# Patient Record
Sex: Female | Born: 1935 | Race: White | Hispanic: No | Marital: Single | Smoking: Never smoker
Health system: Southern US, Community
[De-identification: ages and names within clinical notes are randomized; demographics above are authoritative.]

## PROBLEM LIST (undated history)

## (undated) DIAGNOSIS — N814 Uterovaginal prolapse, unspecified: Secondary | ICD-10-CM

## (undated) DIAGNOSIS — E785 Hyperlipidemia, unspecified: Secondary | ICD-10-CM

## (undated) DIAGNOSIS — I10 Essential (primary) hypertension: Secondary | ICD-10-CM

## (undated) DIAGNOSIS — I272 Pulmonary hypertension, unspecified: Secondary | ICD-10-CM

## (undated) DIAGNOSIS — F0390 Unspecified dementia without behavioral disturbance: Secondary | ICD-10-CM

## (undated) DIAGNOSIS — R32 Unspecified urinary incontinence: Secondary | ICD-10-CM

## (undated) DIAGNOSIS — I340 Nonrheumatic mitral (valve) insufficiency: Secondary | ICD-10-CM

## (undated) DIAGNOSIS — E8809 Other disorders of plasma-protein metabolism, not elsewhere classified: Secondary | ICD-10-CM

## (undated) DIAGNOSIS — F0280 Dementia in other diseases classified elsewhere without behavioral disturbance: Secondary | ICD-10-CM

## (undated) DIAGNOSIS — G309 Alzheimer's disease, unspecified: Secondary | ICD-10-CM

## (undated) DIAGNOSIS — H3530 Unspecified macular degeneration: Secondary | ICD-10-CM

## (undated) DIAGNOSIS — G629 Polyneuropathy, unspecified: Secondary | ICD-10-CM

## (undated) HISTORY — PX: CATARACT EXTRACTION: SUR2

## (undated) HISTORY — PX: ABDOMINAL HYSTERECTOMY: SHX81

## (undated) HISTORY — PX: CHOLECYSTECTOMY: SHX55

## (undated) HISTORY — PX: APPENDECTOMY: SHX54

---

## 2014-01-02 ENCOUNTER — Ambulatory Visit: Payer: Self-pay | Admitting: Internal Medicine

## 2014-01-02 LAB — URINALYSIS, COMPLETE
BACTERIA: NEGATIVE
Bilirubin,UR: NEGATIVE
Blood: NEGATIVE
GLUCOSE, UR: NEGATIVE
Ketone: NEGATIVE
LEUKOCYTE ESTERASE: NEGATIVE
NITRITE: NEGATIVE
PROTEIN: NEGATIVE
Ph: 7 (ref 5.0–8.0)
RBC, UR: NONE SEEN /HPF (ref 0–5)
Specific Gravity: 1.015 (ref 1.000–1.030)

## 2014-01-27 ENCOUNTER — Emergency Department: Payer: Self-pay | Admitting: Emergency Medicine

## 2014-09-16 ENCOUNTER — Emergency Department
Admission: EM | Admit: 2014-09-16 | Discharge: 2014-09-16 | Disposition: A | Discharge status: Home / Self Care | Payer: Medicare Other | Attending: Emergency Medicine | Admitting: Emergency Medicine

## 2014-09-16 ENCOUNTER — Encounter: Payer: Self-pay | Admitting: Emergency Medicine

## 2014-09-16 ENCOUNTER — Emergency Department: Payer: Medicare Other

## 2014-09-16 DIAGNOSIS — Y9289 Other specified places as the place of occurrence of the external cause: Secondary | ICD-10-CM | POA: Diagnosis not present

## 2014-09-16 DIAGNOSIS — Z23 Encounter for immunization: Secondary | ICD-10-CM | POA: Insufficient documentation

## 2014-09-16 DIAGNOSIS — Z79899 Other long term (current) drug therapy: Secondary | ICD-10-CM | POA: Insufficient documentation

## 2014-09-16 DIAGNOSIS — I10 Essential (primary) hypertension: Secondary | ICD-10-CM | POA: Diagnosis not present

## 2014-09-16 DIAGNOSIS — Y9389 Activity, other specified: Secondary | ICD-10-CM | POA: Insufficient documentation

## 2014-09-16 DIAGNOSIS — IMO0002 Reserved for concepts with insufficient information to code with codable children: Secondary | ICD-10-CM

## 2014-09-16 DIAGNOSIS — S0181XA Laceration without foreign body of other part of head, initial encounter: Secondary | ICD-10-CM | POA: Insufficient documentation

## 2014-09-16 DIAGNOSIS — W01198A Fall on same level from slipping, tripping and stumbling with subsequent striking against other object, initial encounter: Secondary | ICD-10-CM | POA: Insufficient documentation

## 2014-09-16 DIAGNOSIS — Z7982 Long term (current) use of aspirin: Secondary | ICD-10-CM | POA: Insufficient documentation

## 2014-09-16 DIAGNOSIS — Y998 Other external cause status: Secondary | ICD-10-CM | POA: Insufficient documentation

## 2014-09-16 HISTORY — DX: Unspecified urinary incontinence: R32

## 2014-09-16 HISTORY — DX: Unspecified dementia, unspecified severity, without behavioral disturbance, psychotic disturbance, mood disturbance, and anxiety: F03.90

## 2014-09-16 HISTORY — DX: Uterovaginal prolapse, unspecified: N81.4

## 2014-09-16 HISTORY — DX: Alzheimer's disease, unspecified: G30.9

## 2014-09-16 HISTORY — DX: Hyperlipidemia, unspecified: E78.5

## 2014-09-16 HISTORY — DX: Essential (primary) hypertension: I10

## 2014-09-16 HISTORY — DX: Dementia in other diseases classified elsewhere, unspecified severity, without behavioral disturbance, psychotic disturbance, mood disturbance, and anxiety: F02.80

## 2014-09-16 HISTORY — DX: Unspecified macular degeneration: H35.30

## 2014-09-16 MED ORDER — LIDOCAINE-EPINEPHRINE (PF) 1 %-1:200000 IJ SOLN
INTRAMUSCULAR | Status: AC
  Filled 2014-09-16: qty 30

## 2014-09-16 MED ORDER — BACITRACIN-NEOMYCIN-POLYMYXIN 400-5-5000 EX OINT
TOPICAL_OINTMENT | Freq: Once | CUTANEOUS | Status: AC
  Administered 2014-09-16: 10:00:00 via TOPICAL
  Filled 2014-09-16: qty 1

## 2014-09-16 MED ORDER — TETANUS-DIPHTHERIA TOXOIDS TD 5-2 LFU IM INJ
0.5000 mL | INJECTION | Freq: Once | INTRAMUSCULAR | Status: AC
  Administered 2014-09-16: 0.5 mL via INTRAMUSCULAR
  Filled 2014-09-16: qty 0.5

## 2014-09-16 MED ORDER — LIDOCAINE-EPINEPHRINE 1 %-1:100000 IJ SOLN
10.0000 mL | Freq: Once | INTRAMUSCULAR | Status: AC
  Administered 2014-09-16: 10 mL via INTRADERMAL
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
## 2014-09-16 NOTE — ED Notes (Signed)
Laceration cleaned with sterile normal saline, neosporin applied, and wrapped with kerlex.

## 2014-09-16 NOTE — ED Notes (Signed)
Dr. Darnelle Catalan at bedside attending to laceration to head.

## 2014-09-16 NOTE — ED Notes (Signed)
Laceration closed with 7 staples by Dr. Darnelle Catalan.

## 2014-10-24 ENCOUNTER — Encounter: Payer: Self-pay | Admitting: Emergency Medicine

## 2014-10-24 ENCOUNTER — Emergency Department
Admission: EM | Admit: 2014-10-24 | Discharge: 2014-10-24 | Disposition: A | Discharge status: Home / Self Care | Payer: Medicare Other | Attending: Emergency Medicine | Admitting: Emergency Medicine

## 2014-10-24 ENCOUNTER — Emergency Department: Payer: Medicare Other

## 2014-10-24 DIAGNOSIS — I10 Essential (primary) hypertension: Secondary | ICD-10-CM | POA: Diagnosis not present

## 2014-10-24 DIAGNOSIS — W1839XA Other fall on same level, initial encounter: Secondary | ICD-10-CM | POA: Diagnosis not present

## 2014-10-24 DIAGNOSIS — Y998 Other external cause status: Secondary | ICD-10-CM | POA: Diagnosis not present

## 2014-10-24 DIAGNOSIS — Y92129 Unspecified place in nursing home as the place of occurrence of the external cause: Secondary | ICD-10-CM | POA: Insufficient documentation

## 2014-10-24 DIAGNOSIS — Z7982 Long term (current) use of aspirin: Secondary | ICD-10-CM | POA: Diagnosis not present

## 2014-10-24 DIAGNOSIS — R531 Weakness: Secondary | ICD-10-CM | POA: Diagnosis present

## 2014-10-24 DIAGNOSIS — Y9389 Activity, other specified: Secondary | ICD-10-CM | POA: Diagnosis not present

## 2014-10-24 DIAGNOSIS — M25552 Pain in left hip: Secondary | ICD-10-CM

## 2014-10-24 DIAGNOSIS — Z79899 Other long term (current) drug therapy: Secondary | ICD-10-CM | POA: Insufficient documentation

## 2014-10-24 DIAGNOSIS — S79912A Unspecified injury of left hip, initial encounter: Secondary | ICD-10-CM | POA: Insufficient documentation

## 2014-10-24 LAB — URINALYSIS COMPLETE WITH MICROSCOPIC (ARMC ONLY)
BILIRUBIN URINE: NEGATIVE
GLUCOSE, UA: NEGATIVE mg/dL
HGB URINE DIPSTICK: NEGATIVE
Ketones, ur: NEGATIVE mg/dL
Leukocytes, UA: NEGATIVE
NITRITE: NEGATIVE
Protein, ur: NEGATIVE mg/dL
SPECIFIC GRAVITY, URINE: 1.004 — AB (ref 1.005–1.030)
pH: 7 (ref 5.0–8.0)

## 2014-10-24 LAB — CBC WITH DIFFERENTIAL/PLATELET
BASOS PCT: 1 %
Basophils Absolute: 0.1 10*3/uL (ref 0–0.1)
EOS ABS: 0.4 10*3/uL (ref 0–0.7)
EOS PCT: 5 %
HCT: 44.5 % (ref 35.0–47.0)
HEMOGLOBIN: 14.8 g/dL (ref 12.0–16.0)
Lymphocytes Relative: 14 %
Lymphs Abs: 1.2 10*3/uL (ref 1.0–3.6)
MCH: 31.7 pg (ref 26.0–34.0)
MCHC: 33.3 g/dL (ref 32.0–36.0)
MCV: 95.2 fL (ref 80.0–100.0)
MONOS PCT: 7 %
Monocytes Absolute: 0.6 10*3/uL (ref 0.2–0.9)
NEUTROS PCT: 73 %
Neutro Abs: 6 10*3/uL (ref 1.4–6.5)
PLATELETS: 206 10*3/uL (ref 150–440)
RBC: 4.67 MIL/uL (ref 3.80–5.20)
RDW: 13.8 % (ref 11.5–14.5)
WBC: 8.2 10*3/uL (ref 3.6–11.0)

## 2014-10-24 LAB — BASIC METABOLIC PANEL
Anion gap: 9 (ref 5–15)
BUN: 22 mg/dL — AB (ref 6–20)
CALCIUM: 9.3 mg/dL (ref 8.9–10.3)
CHLORIDE: 101 mmol/L (ref 101–111)
CO2: 32 mmol/L (ref 22–32)
CREATININE: 0.56 mg/dL (ref 0.44–1.00)
Glucose, Bld: 100 mg/dL — ABNORMAL HIGH (ref 65–99)
Potassium: 3.8 mmol/L (ref 3.5–5.1)
SODIUM: 142 mmol/L (ref 135–145)

## 2014-10-24 LAB — GLUCOSE, CAPILLARY: Glucose-Capillary: 69 mg/dL (ref 65–99)

## 2014-10-24 NOTE — ED Notes (Signed)
[facility] with c/o of weakness since Wednesday. Per EMS, pt received flu shot Wednesday and sine then has declined in ambulation. Pt with hx of alzheimers disease. Alert and pleasant on arrival. BS 69

## 2014-10-24 NOTE — ED Provider Notes (Signed)
[facility] Emergency Department Provider Note  ____________________________________________  Time seen: 1230  I have reviewed the triage vital signs and the nursing notes.   HISTORY  Chief Complaint Weakness   History limited by: Dementia   HPI Regina Bridges is a 81 y.o. female who presents to the emergency department today because of concern for left hip pain and difficulty with ambulation. The patient unfortunately has a history of dementia so is unable to give a good history. States that she has fallen a couple of times with her left leg. Is unable to tell me how long it has been giving her issues. Denies any other symptoms to me.   Past Medical History  Diagnosis Date  . Hyperlipemia   . Dementia   . Alzheimer disease   . Macular degeneration   . Hypertension   . Uterine prolapse   . Urinary incontinence     There are no active problems to display for this patient.   History reviewed. No pertinent past surgical history.  Current Outpatient Rx  Name  Route  Sig  Dispense  Refill  . acetaminophen (TYLENOL) 500 MG tablet   Oral   Take 1,000 mg by mouth 2 (two) times daily.         Marland Kitchen. aspirin EC 81 MG tablet   Oral   Take 81 mg by mouth daily.         . benazepril (LOTENSIN) 10 MG tablet   Oral   Take 10 mg by mouth daily.         . carvedilol (COREG) 6.25 MG tablet   Oral   Take 6.25 mg by mouth 2 (two) times daily with a meal.         . haloperidol (HALDOL) 0.5 MG tablet   Oral   Take 0.5 mg by mouth every 8 (eight) hours as needed for agitation.         . Melatonin 1 MG TABS   Oral   Take 5 mg by mouth at bedtime.         . Multiple Vitamin (MULTIVITAMIN) tablet   Oral   Take 1 tablet by mouth daily.         . Multiple Vitamins-Minerals (PRESERVISION AREDS 2) CAPS   Oral   Take 1 capsule by mouth 2 (two) times daily.         Marland Kitchen. omeprazole (PRILOSEC) 20 MG capsule   Oral   Take 20 mg by mouth daily.          Marland Kitchen. oxybutynin (DITROPAN) 5 MG tablet   Oral   Take 5 mg by mouth 2 (two) times a week. Take 1 tablet twice a week on Tuesday and Saturday.         . polyethylene glycol (MIRALAX / GLYCOLAX) packet   Oral   Take 17 g by mouth daily.         Marland Kitchen. ibuprofen (ADVIL,MOTRIN) 200 MG tablet   Oral   Take 400 mg by mouth 2 (two) times daily as needed.           Allergies Review of patient's allergies indicates no known allergies.  History reviewed. No pertinent family history.  Social History Social History  Substance Use Topics  . Smoking status: Unknown If Ever Smoked  . Smokeless tobacco: None  . Alcohol Use: No    Review of Systems  Constitutional: Negative for fever. Cardiovascular: Negative for chest pain. Respiratory: Negative for shortness of breath. Gastrointestinal:  Negative for abdominal pain, vomiting and diarrhea. Genitourinary: Negative for dysuria. Musculoskeletal: Negative for back pain. Left hip pain. Skin: Negative for rash. Neurological: Negative for headaches, focal weakness or numbness.  10-point ROS otherwise negative.  ____________________________________________   PHYSICAL EXAM:  VITAL SIGNS: ED Triage Vitals  Enc Vitals Group     BP 10/24/14 1113 204/87 mmHg     Pulse Rate 10/24/14 1113 54     Resp 10/24/14 1113 20     Temp 10/24/14 1113 97.7 F (36.5 C)     Temp Source 10/24/14 1113 Oral     SpO2 10/24/14 1113 97 %     Weight 10/24/14 1113 140 lb 8 oz (63.73 kg)     Height 10/24/14 1113  (1.651 m)   Constitutional: Alert and oriented. Well appearing and in no distress. Eyes: Conjunctivae are normal. PERRL. Normal extraocular movements. ENT   Head: Normocephalic and atraumatic.   Nose: No congestion/rhinnorhea.   Mouth/Throat: Mucous membranes are moist.   Neck: No stridor. Hematological/Lymphatic/Immunilogical: No cervical lymphadenopathy. Cardiovascular: Normal rate, regular rhythm.  No murmurs, rubs, or  gallops. Respiratory: Normal respiratory effort without tachypnea nor retractions. Breath sounds are clear and equal bilaterally. No wheezes/rales/rhonchi. Gastrointestinal: Soft and nontender. No distention.  Genitourinary: Deferred Musculoskeletal: Normal range of motion in all extremities. No joint effusions.  No lower extremity tenderness nor edema. Neurologic:  Normal speech and language. No gross focal neurologic deficits are appreciated. Speech is normal.  Skin:  Skin is warm, dry and intact. No rash noted. Psychiatric: Mood and affect are normal. Speech and behavior are normal. Patient exhibits appropriate insight and judgment.  ____________________________________________    LABS (pertinent positives/negatives)  Labs Reviewed  BASIC METABOLIC PANEL - Abnormal; Notable for the following:    Glucose, Bld 100 (*)    BUN 22 (*)    All other components within normal limits  URINALYSIS COMPLETEWITH MICROSCOPIC ([facility] ONLY) - Abnormal; Notable for the following:    Color, Urine STRAW (*)    APPearance CLEAR (*)    Specific Gravity, Urine 1.004 (*)    Bacteria, UA RARE (*)    Squamous Epithelial / LPF 0-5 (*)    All other components within normal limits  CBC WITH DIFFERENTIAL/PLATELET  GLUCOSE, CAPILLARY     ____________________________________________   EKG  None  ____________________________________________    RADIOLOGY  Left hip x-ray IMPRESSION: No acute abnormality noted.  I, Alysiana Ethridge, personally viewed and evaluated these images (plain radiographs) as part of my medical decision making. ____________________________________________   PROCEDURES  Procedure(s) performed: None  Critical Care performed: No  ____________________________________________   INITIAL IMPRESSION / ASSESSMENT AND PLAN / ED COURSE  Pertinent labs & imaging results that were available during my care of the patient were reviewed by me and considered in my medical decision  making (see chart for details).  Patient presented to the emergency department because of concern for left hip pain and weakness. X-ray without concerning findings. Blood work without concerning findings. This point unclear cause of patient's complaints however she does appear quite comfortable at time of discharge. Will plan on having patient go back to living facility.  ____________________________________________   FINAL CLINICAL IMPRESSION(S) / ED DIAGNOSES  Final diagnoses:  Left hip pain     Phineas Semen, MD 10/24/14 [phone]

## 2014-10-24 NOTE — Discharge Instructions (Signed)
Please seek medical attention for any high fevers, chest pain, shortness of breath, change in behavior, persistent vomiting, bloody stool or any other new or concerning symptoms. ° ° °Joint Pain °Joint pain, which is also called arthralgia, can be caused by many things. Joint pain often goes away when you follow your health care provider's instructions for relieving pain at home. However, joint pain can also be caused by conditions that require further treatment. Common causes of joint pain include: °· Bruising in the area of the joint. °· Overuse of the joint. °· Wear and tear on the joints that occur with aging (osteoarthritis). °· Various other forms of arthritis. °· A buildup of a crystal form of uric acid in the joint (gout). °· Infections of the joint (septic arthritis) or of the bone (osteomyelitis). °Your health care provider may recommend medicine to help with the pain. If your joint pain continues, additional tests may be needed to diagnose your condition. °HOME CARE INSTRUCTIONS °Watch your condition for any changes. Follow these instructions as directed to lessen the pain that you are feeling. °· Take medicines only as directed by your health care provider. °· Rest the affected area for as long as your health care provider says that you should. If directed to do so, raise the painful joint above the level of your heart while you are sitting or lying down. °· Do not do things that cause or worsen pain. °· If directed, apply ice to the painful area: °¨ Put ice in a plastic bag. °¨ Place a towel between your skin and the bag. °¨ Leave the ice on for 20 minutes, 2-3 times per day. °· Wear an elastic bandage, splint, or sling as directed by your health care provider. Loosen the elastic bandage or splint if your fingers or toes become numb and tingle, or if they turn cold and blue. °· Begin exercising or stretching the affected area as directed by your health care provider. Ask your health care provider what  types of exercise are safe for you. °· Keep all follow-up visits as directed by your health care provider. This is important. °SEEK MEDICAL CARE IF: °· Your pain increases, and medicine does not help. °· Your joint pain does not improve within 3 days. °· You have increased bruising or swelling. °· You have a fever. °· You lose 10 lb (4.5 kg) or more without trying. °SEEK IMMEDIATE MEDICAL CARE IF: °· You are not able to move the joint. °· Your fingers or toes become numb or they turn cold and blue. °  °This information is not intended to replace advice given to you by your health care provider. Make sure you discuss any questions you have with your health care provider. °  °Document Released: 12/20/2004 Document Revised: 01/10/2014 Document Reviewed: 10/01/2013 °Elsevier Interactive Patient Education ©2016 [employer] ° °

## 2014-11-02 ENCOUNTER — Other Ambulatory Visit: Payer: Self-pay

## 2014-11-02 ENCOUNTER — Emergency Department: Payer: Medicare Other

## 2014-11-02 ENCOUNTER — Inpatient Hospital Stay
Admission: EM | Admit: 2014-11-02 | Discharge: 2014-11-06 | DRG: 481 | Disposition: A | Discharge status: Skilled Nursing Facility | Payer: Medicare Other | Attending: Internal Medicine | Admitting: Internal Medicine

## 2014-11-02 ENCOUNTER — Encounter: Payer: Self-pay | Admitting: Emergency Medicine

## 2014-11-02 DIAGNOSIS — S7292XA Unspecified fracture of left femur, initial encounter for closed fracture: Secondary | ICD-10-CM

## 2014-11-02 DIAGNOSIS — M79652 Pain in left thigh: Secondary | ICD-10-CM

## 2014-11-02 DIAGNOSIS — Z79899 Other long term (current) drug therapy: Secondary | ICD-10-CM | POA: Diagnosis not present

## 2014-11-02 DIAGNOSIS — Z9049 Acquired absence of other specified parts of digestive tract: Secondary | ICD-10-CM

## 2014-11-02 DIAGNOSIS — W19XXXA Unspecified fall, initial encounter: Secondary | ICD-10-CM | POA: Diagnosis present

## 2014-11-02 DIAGNOSIS — F0280 Dementia in other diseases classified elsewhere without behavioral disturbance: Secondary | ICD-10-CM | POA: Diagnosis present

## 2014-11-02 DIAGNOSIS — Z9849 Cataract extraction status, unspecified eye: Secondary | ICD-10-CM | POA: Diagnosis not present

## 2014-11-02 DIAGNOSIS — I10 Essential (primary) hypertension: Secondary | ICD-10-CM | POA: Diagnosis present

## 2014-11-02 DIAGNOSIS — Z7982 Long term (current) use of aspirin: Secondary | ICD-10-CM | POA: Diagnosis not present

## 2014-11-02 DIAGNOSIS — Z90710 Acquired absence of both cervix and uterus: Secondary | ICD-10-CM

## 2014-11-02 DIAGNOSIS — Z66 Do not resuscitate: Secondary | ICD-10-CM | POA: Diagnosis present

## 2014-11-02 DIAGNOSIS — H3530 Unspecified macular degeneration: Secondary | ICD-10-CM | POA: Diagnosis present

## 2014-11-02 DIAGNOSIS — I5181 Takotsubo syndrome: Secondary | ICD-10-CM | POA: Diagnosis present

## 2014-11-02 DIAGNOSIS — G20 Parkinson's disease: Secondary | ICD-10-CM | POA: Diagnosis present

## 2014-11-02 DIAGNOSIS — I340 Nonrheumatic mitral (valve) insufficiency: Secondary | ICD-10-CM | POA: Diagnosis present

## 2014-11-02 DIAGNOSIS — G309 Alzheimer's disease, unspecified: Secondary | ICD-10-CM | POA: Diagnosis present

## 2014-11-02 DIAGNOSIS — I429 Cardiomyopathy, unspecified: Secondary | ICD-10-CM | POA: Diagnosis present

## 2014-11-02 DIAGNOSIS — T148XXA Other injury of unspecified body region, initial encounter: Secondary | ICD-10-CM

## 2014-11-02 DIAGNOSIS — S72142A Displaced intertrochanteric fracture of left femur, initial encounter for closed fracture: Principal | ICD-10-CM | POA: Diagnosis present

## 2014-11-02 DIAGNOSIS — Z800 Family history of malignant neoplasm of digestive organs: Secondary | ICD-10-CM | POA: Diagnosis not present

## 2014-11-02 HISTORY — DX: Pulmonary hypertension, unspecified: I27.20

## 2014-11-02 HISTORY — DX: Other disorders of plasma-protein metabolism, not elsewhere classified: E88.09

## 2014-11-02 HISTORY — DX: Polyneuropathy, unspecified: G62.9

## 2014-11-02 HISTORY — DX: Nonrheumatic mitral (valve) insufficiency: I34.0

## 2014-11-02 LAB — CBC WITH DIFFERENTIAL/PLATELET
BASOS ABS: 0.1 10*3/uL (ref 0–0.1)
BASOS PCT: 0 %
EOS ABS: 0.1 10*3/uL (ref 0–0.7)
Eosinophils Relative: 1 %
HCT: 42.9 % (ref 35.0–47.0)
HEMOGLOBIN: 14.5 g/dL (ref 12.0–16.0)
Lymphocytes Relative: 12 %
Lymphs Abs: 1.4 10*3/uL (ref 1.0–3.6)
MCH: 31.8 pg (ref 26.0–34.0)
MCHC: 33.8 g/dL (ref 32.0–36.0)
MCV: 94.3 fL (ref 80.0–100.0)
MONOS PCT: 11 %
Monocytes Absolute: 1.3 10*3/uL — ABNORMAL HIGH (ref 0.2–0.9)
NEUTROS PCT: 76 %
Neutro Abs: 9.3 10*3/uL — ABNORMAL HIGH (ref 1.4–6.5)
PLATELETS: 328 10*3/uL (ref 150–440)
RBC: 4.55 MIL/uL (ref 3.80–5.20)
RDW: 13.5 % (ref 11.5–14.5)
WBC: 12.2 10*3/uL — AB (ref 3.6–11.0)

## 2014-11-02 LAB — BASIC METABOLIC PANEL
Anion gap: 5 (ref 5–15)
BUN: 33 mg/dL — AB (ref 6–20)
CO2: 28 mmol/L (ref 22–32)
CREATININE: 0.82 mg/dL (ref 0.44–1.00)
Calcium: 9 mg/dL (ref 8.9–10.3)
Chloride: 105 mmol/L (ref 101–111)
GFR calc Af Amer: >60 mL/min (ref >60)
GFR, EST NON AFRICAN AMERICAN: 58 mL/min — AB (ref >60)
GLUCOSE: 130 mg/dL — AB (ref 65–99)
POTASSIUM: 3.6 mmol/L (ref 3.5–5.1)
Sodium: 138 mmol/L (ref 135–145)

## 2014-11-02 LAB — MRSA PCR SCREENING: MRSA by PCR: NEGATIVE

## 2014-11-02 LAB — PROTIME-INR
INR: 0.94
Prothrombin Time: 12.8 seconds (ref 11.4–15.0)

## 2014-11-02 LAB — APTT: aPTT: 34 seconds (ref 24–36)

## 2014-11-02 MED ORDER — FLEET ENEMA 7-19 GM/118ML RE ENEM: 1.0000 | ENEMA | Freq: Once | RECTAL | Status: DC | PRN

## 2014-11-02 MED ORDER — ONDANSETRON HCL 4 MG PO TABS: 4.0000 mg | ORAL_TABLET | Freq: Four times a day (QID) | ORAL | Status: DC | PRN

## 2014-11-02 MED ORDER — MORPHINE SULFATE (PF) 2 MG/ML IV SOLN
2.0000 mg | INTRAVENOUS | Status: DC | PRN
  Administered 2014-11-03: 2 mg via INTRAVENOUS
  Filled 2014-11-02: qty 1

## 2014-11-02 MED ORDER — CARVEDILOL 6.25 MG PO TABS: 12.5000 mg | ORAL_TABLET | Freq: Two times a day (BID) | ORAL | Status: DC

## 2014-11-02 MED ORDER — CARVEDILOL 6.25 MG PO TABS
6.2500 mg | ORAL_TABLET | Freq: Once | ORAL | Status: AC
  Administered 2014-11-02: 6.25 mg via ORAL
  Filled 2014-11-02: qty 1

## 2014-11-02 MED ORDER — HYDRALAZINE HCL 20 MG/ML IJ SOLN
10.0000 mg | Freq: Four times a day (QID) | INTRAMUSCULAR | Status: DC | PRN
  Administered 2014-11-06: 10 mg via INTRAVENOUS
  Filled 2014-11-02 (×2): qty 1

## 2014-11-02 MED ORDER — ACETAMINOPHEN 325 MG PO TABS: 650.0000 mg | ORAL_TABLET | Freq: Four times a day (QID) | ORAL | Status: DC | PRN

## 2014-11-02 MED ORDER — IBUPROFEN 400 MG PO TABS
400.0000 mg | ORAL_TABLET | Freq: Once | ORAL | Status: AC
  Administered 2014-11-02: 400 mg via ORAL
  Filled 2014-11-02: qty 1

## 2014-11-02 MED ORDER — POLYETHYLENE GLYCOL 3350 17 G PO PACK
17.0000 g | PACK | Freq: Every day | ORAL | Status: DC
  Administered 2014-11-02 – 2014-11-05 (×3): 17 g via ORAL
  Filled 2014-11-02 (×3): qty 1

## 2014-11-02 MED ORDER — HALOPERIDOL 0.5 MG PO TABS
0.5000 mg | ORAL_TABLET | Freq: Three times a day (TID) | ORAL | Status: DC | PRN
  Administered 2014-11-04: 0.5 mg via ORAL
  Filled 2014-11-02 (×2): qty 1

## 2014-11-02 MED ORDER — POLYETHYLENE GLYCOL 3350 17 G PO PACK: 17.0000 g | PACK | Freq: Every day | ORAL | Status: DC | PRN

## 2014-11-02 MED ORDER — DOCUSATE SODIUM 100 MG PO CAPS
100.0000 mg | ORAL_CAPSULE | Freq: Two times a day (BID) | ORAL | Status: DC
  Administered 2014-11-02 – 2014-11-04 (×3): 100 mg via ORAL
  Filled 2014-11-02 (×4): qty 1

## 2014-11-02 MED ORDER — HYDROCODONE-ACETAMINOPHEN 5-325 MG PO TABS: 1.0000 | ORAL_TABLET | ORAL | Status: DC | PRN

## 2014-11-02 MED ORDER — BISACODYL 5 MG PO TBEC: 5.0000 mg | DELAYED_RELEASE_TABLET | Freq: Every day | ORAL | Status: DC | PRN

## 2014-11-02 MED ORDER — ACETAMINOPHEN 650 MG RE SUPP: 650.0000 mg | Freq: Four times a day (QID) | RECTAL | Status: DC | PRN

## 2014-11-02 MED ORDER — FUROSEMIDE 10 MG/ML IJ SOLN
20.0000 mg | Freq: Once | INTRAMUSCULAR | Status: AC
  Administered 2014-11-02: 20 mg via INTRAVENOUS
  Filled 2014-11-02: qty 4

## 2014-11-02 MED ORDER — CARVEDILOL 6.25 MG PO TABS
6.2500 mg | ORAL_TABLET | Freq: Two times a day (BID) | ORAL | Status: DC
  Administered 2014-11-03 – 2014-11-06 (×7): 6.25 mg via ORAL
  Filled 2014-11-02 (×8): qty 1

## 2014-11-02 MED ORDER — ACETAMINOPHEN 325 MG PO TABS
650.0000 mg | ORAL_TABLET | Freq: Once | ORAL | Status: AC
  Administered 2014-11-02: 650 mg via ORAL
  Filled 2014-11-02: qty 2

## 2014-11-02 MED ORDER — ONDANSETRON HCL 4 MG/2ML IJ SOLN
4.0000 mg | Freq: Four times a day (QID) | INTRAMUSCULAR | Status: DC | PRN
  Administered 2014-11-03: 4 mg via INTRAVENOUS
  Filled 2014-11-02: qty 2

## 2014-11-02 MED ORDER — OXYBUTYNIN CHLORIDE 5 MG PO TABS
5.0000 mg | ORAL_TABLET | ORAL | Status: DC
  Administered 2014-11-06: 5 mg via ORAL
  Filled 2014-11-02: qty 1

## 2014-11-02 MED ORDER — MELATONIN 1 MG PO TABS: 5.0000 mg | ORAL_TABLET | Freq: Every day | ORAL | Status: DC

## 2014-11-02 MED ORDER — BENAZEPRIL HCL 20 MG PO TABS
20.0000 mg | ORAL_TABLET | Freq: Every day | ORAL | Status: DC
  Administered 2014-11-04 – 2014-11-06 (×3): 20 mg via ORAL
  Filled 2014-11-02 (×3): qty 1

## 2014-11-02 MED ORDER — CEFAZOLIN SODIUM-DEXTROSE 2-3 GM-% IV SOLR
2.0000 g | INTRAVENOUS | Status: AC
  Administered 2014-11-03: 2 g via INTRAVENOUS
  Filled 2014-11-02: qty 50

## 2014-11-02 MED ORDER — PANTOPRAZOLE SODIUM 40 MG PO TBEC
40.0000 mg | DELAYED_RELEASE_TABLET | Freq: Every day | ORAL | Status: DC
  Administered 2014-11-04 – 2014-11-06 (×3): 40 mg via ORAL
  Filled 2014-11-02 (×3): qty 1

## 2014-11-02 MED ORDER — ALBUTEROL SULFATE (2.5 MG/3ML) 0.083% IN NEBU: 2.5000 mg | INHALATION_SOLUTION | RESPIRATORY_TRACT | Status: DC | PRN

## 2014-11-02 NOTE — H&P (Signed)
[facility] [place] [facility]   PATIENT NAME: Regina Bridges    MR#:  [id number]  DATE OF BIRTH:  01/31/1915  DATE OF ADMISSION:  11/02/2014  PRIMARY CARE PHYSICIAN: No PCP Per Patient   REQUESTING/REFERRING PHYSICIAN: Dr. Inocencio HomesGayle ED  CHIEF COMPLAINT:   Chief Complaint  Patient presents with  . Leg Pain    HISTORY OF PRESENT ILLNESS:  Regina Bridges  is a 81 y.o. female with a known history of hypertension, dementia, takatsubu cardiomyopathy presents to the emergency room with left hip pain. Patient has not been able to bear weight. She was recently in the emergency room 10 days back with left hip pain when she had an x-ray which showed no fracture. She had a flu shot in that hip recently and that was thought to be the cause. Today x-ray shows angulated left femur fracture. Patient needs surgery and is being admitted to the hospital. History has been obtained from old records and ER staff. Patient unable to contribute to history due to her severe dementia.  PAST MEDICAL HISTORY:   Past Medical History  Diagnosis Date  . Hyperlipemia   . Dementia   . Alzheimer disease   . Macular degeneration   . Hypertension   . Uterine prolapse   . Urinary incontinence   . Mitral regurgitation   . Takatsuki syndrome   . Pulmonary hypertension (HCC)     PAST SURGICAL HISTORY:   Past Surgical History  Procedure Laterality Date  . Appendectomy    . Cholecystectomy    . Cataract extraction    . Abdominal hysterectomy      SOCIAL HISTORY:   Social History  Substance Use Topics  . Smoking status: Unknown If Ever Smoked  . Smokeless tobacco: Not on file  . Alcohol Use: No    FAMILY HISTORY:   Family History  Problem Relation Age of Onset  . Pancreatic cancer Father     DRUG ALLERGIES:  No Known Allergies  REVIEW OF SYSTEMS:   Review of Systems  Unable to perform ROS: dementia    MEDICATIONS AT HOME:   Prior to Admission medications    Medication Sig Start Date End Date Taking? Authorizing Provider  acetaminophen (TYLENOL) 500 MG tablet Take 1,000 mg by mouth 2 (two) times daily.    Historical Provider, MD  aspirin EC 81 MG tablet Take 81 mg by mouth daily.    Historical Provider, MD  benazepril (LOTENSIN) 10 MG tablet Take 10 mg by mouth daily.    Historical Provider, MD  carvedilol (COREG) 6.25 MG tablet Take 6.25 mg by mouth 2 (two) times daily with a meal.    Historical Provider, MD  haloperidol (HALDOL) 0.5 MG tablet Take 0.5 mg by mouth every 8 (eight) hours as needed for agitation.    Historical Provider, MD  ibuprofen (ADVIL,MOTRIN) 200 MG tablet Take 400 mg by mouth 2 (two) times daily as needed.    Historical Provider, MD  Melatonin 1 MG TABS Take 5 mg by mouth at bedtime.    Historical Provider, MD  Multiple Vitamin (MULTIVITAMIN) tablet Take 1 tablet by mouth daily.    Historical Provider, MD  Multiple Vitamins-Minerals (PRESERVISION AREDS 2) CAPS Take 1 capsule by mouth 2 (two) times daily.    Historical Provider, MD  omeprazole (PRILOSEC) 20 MG capsule Take 20 mg by mouth daily.    Historical Provider, MD  oxybutynin (DITROPAN) 5 MG tablet Take 5 mg by mouth 2 (two) times  a week. Take 1 tablet twice a week on Tuesday and Saturday.    Historical Provider, MD  polyethylene glycol (MIRALAX / GLYCOLAX) packet Take 17 g by mouth daily.    Historical Provider, MD      VITAL SIGNS:  Blood pressure 145/82, pulse 71, temperature 97.5 F (36.4 C), temperature source Oral, resp. rate 18, height  (1.6 m), weight 63.504 kg (140 lb), SpO2 94 %.  PHYSICAL EXAMINATION:  Physical Exam  GENERAL:  81 y.o.-year-old patient lying in the bed with no acute distress.  EYES: Pupils equal, round, reactive to light and accommodation. No scleral icterus. Extraocular muscles intact.  HEENT: Head atraumatic, normocephalic. Oropharynx and nasopharynx clear. No oropharyngeal erythema, moist oral mucosa  NECK:  Supple, no jugular  venous distention. No thyroid enlargement, no tenderness.  LUNGS: Normal breath sounds bilaterally, no wheezing, rales, rhonchi. No use of accessory muscles of respiration.  CARDIOVASCULAR: S1, S2 normal. No murmurs, rubs, or gallops.  ABDOMEN: Soft, nontender, nondistended. Bowel sounds present. No organomegaly or mass.  EXTREMITIES: No pedal edema, cyanosis, or clubbing. + 2 pedal & radial pulses b/l.  Tenderness to left hip NEUROLOGIC: Cranial nerves II through XII are intact. No focal Motor or sensory deficits appreciated b/l PSYCHIATRIC: The patient is alert and awake and pleasantly confused. SKIN: No obvious rash, lesion, or ulcer.   LABORATORY PANEL:   CBC  Recent Labs Lab 11/02/14 1643  WBC 12.2*  HGB 14.5  HCT 42.9  PLT 328   ------------------------------------------------------------------------------------------------------------------  Chemistries   Recent Labs Lab 11/02/14 1643  NA 138  K 3.6  CL 105  CO2 28  GLUCOSE 130*  BUN 33*  CREATININE 0.82  CALCIUM 9.0   ------------------------------------------------------------------------------------------------------------------  Cardiac Enzymes No results for input(s): TROPONINI in the last 168 hours. ------------------------------------------------------------------------------------------------------------------  RADIOLOGY:  Dg Chest Portable 1 View  11/02/2014  CLINICAL DATA:  Preoperative image EXAM: PORTABLE CHEST 1 VIEW COMPARISON:  None. FINDINGS: Moderate to severe cardiac enlargement. Mild vascular congestion. Mild chronic appearing bronchitic change. No evidence of pulmonary edema or consolidation. Limited evaluation of the left lower lobe due to patient rotation and cardiac enlargement. IMPRESSION: Significant cardiac enlargement. With vascular congestion with no evidence of acute findings. Electronically Signed   By: Esperanza Heir M.D.   On: 11/02/2014 16:48   Dg Hip Unilat With Pelvis 2-3  Views Left  11/02/2014  CLINICAL DATA:  Left hip pain after falling yesterday. Initial encounter. EXAM: DG HIP (WITH OR WITHOUT PELVIS) 2-3V LEFT COMPARISON:  Radiographs 10/24/2014. FINDINGS: The bones are demineralized. There is an acute, angulated and comminuted intertrochanteric left femur fracture. There is no evidence of dislocation or acute pelvic fracture. Postsurgical changes are present within the pelvis. There are scattered vascular calcifications. IMPRESSION: Acute, angulated and comminuted left intertrochanteric femur fracture. Electronically Signed   By: Carey Bullocks M.D.   On: 11/02/2014 16:48     IMPRESSION AND PLAN:   * Left intertrochanteric fracture Patient is mobile at baseline and will need surgery. Seems to be at baseline although patient is a poor historian. EKG shows no acute changes. She does seem to have history of Parkinson will cardiomyopathy in 2006. At this point patient is being admitted for a necessary surgery and will not need any further workup but considering her age and history would at least be a moderate risk and we will watch closely for any complications. Pain control. Physical therapy to see the patient after surgery. Discussed with orthopedics Dr. Joice Lofts. DVT prophylaxis  as per orthopedics.  * Accelerated hypertension Continue home medications. Will add IV when necessary medications at this time. Need to increase doses on medications if blood pressure continues to be uncontrolled.  * Dementia Patient is presently confused at this time. Watch for inpatient delirium. We'll add IV when necessary Ativan.   All the records are reviewed and case discussed with ED provider. Management plans discussed with the patient, family and they are in agreement.  CODE STATUS: DNR  TOTAL TIME TAKING CARE OF THIS PATIENT: 40 minutes.    Milagros Loll R M.D on 11/02/2014 at 5:31 PM  Between 7am to 6pm - Pager - [phone]  After 6pm go to [email or web address] -  password [email or web address] [facility]  [facility]  Office  [phone]  CC: Primary care physician; No PCP Per Patient     Note: This dictation was prepared with Dragon dictation along with smaller phrase technology. Any transcriptional errors that result from this process are unintentional.

## 2014-11-02 NOTE — ED Notes (Signed)
Per Daughter Regina RipperClaire who is the POA, she did state patient fell last night around 830pm going to the bathroom and has been complaining of hip pain for the past week and has had decreasing mobility over the past week.

## 2014-11-02 NOTE — ED Notes (Signed)
[facility] Pt received flu shot 1.5 weeks ago in left thigh. Staff reported to EMS that left thigh is swollen and tender and patient has difficulty bearing weight to left side. Pt lives in memory care unit.

## 2014-11-02 NOTE — Consult Note (Signed)
ORTHOPAEDIC CONSULTATION  REQUESTING PHYSICIAN: Gayla Doss, MD  Chief Complaint:   Left hip pain.  History of Present Illness: Regina Bridges is a 81 y.o. female with multiple medical problems, including Alzheimer's, atrial regurgitation, pulmonary hypertension, hypertension, hyperlipidemia, and Takatsuki syndrome, who lives in an assisted living facility in [place]. The patient apparently suffered an unwitnessed fall approximately 9-10 days ago and was brought to the emergency room complaining of left hip pain. X-rays at that time were unremarkable for any fracture. The patient was sent home. Since that time, she has continued to complain of left hip pain, especially with any attempted ambulation. Apparently she again fell last night. Because of continued hip pain and the inability to bear weight, she was brought to the emergency room today where x-rays demonstrated a displaced intertrochanteric fracture of her left hip. The patient did not appear to have struck her head and did not appear to have any loss of consciousness. Because of her dementia, she is unable to state what the exact cause of her fall was.  Past Medical History  Diagnosis Date  . Hyperlipemia   . Dementia   . Alzheimer disease   . Macular degeneration   . Hypertension   . Uterine prolapse   . Urinary incontinence   . Mitral regurgitation   . Takatsuki syndrome   . Pulmonary hypertension [facility])    Past Surgical History  Procedure Laterality Date  . Appendectomy    . Cholecystectomy    . Cataract extraction    . Abdominal hysterectomy     Social History   Social History  . Marital Status: Single    Spouse Name: N/A  . Number of Children: N/A  . Years of Education: N/A   Social History Main Topics  . Smoking status: Unknown If Ever Smoked  . Smokeless tobacco: None  . Alcohol Use: No  . Drug Use: None  . Sexual Activity: Not Asked   Other Topics  Concern  . None   Social History Narrative   Family History  Problem Relation Age of Onset  . Pancreatic cancer Father    No Known Allergies Prior to Admission medications   Medication Sig Start Date End Date Taking? Authorizing Provider  acetaminophen (TYLENOL) 500 MG tablet Take 1,000 mg by mouth 2 (two) times daily.    Historical Provider, MD  aspirin EC 81 MG tablet Take 81 mg by mouth daily.    Historical Provider, MD  benazepril (LOTENSIN) 10 MG tablet Take 10 mg by mouth daily.    Historical Provider, MD  carvedilol (COREG) 6.25 MG tablet Take 6.25 mg by mouth 2 (two) times daily with a meal.    Historical Provider, MD  haloperidol (HALDOL) 0.5 MG tablet Take 0.5 mg by mouth every 8 (eight) hours as needed for agitation.    Historical Provider, MD  ibuprofen (ADVIL,MOTRIN) 200 MG tablet Take 400 mg by mouth 2 (two) times daily as needed.    Historical Provider, MD  Melatonin 1 MG TABS Take 5 mg by mouth at bedtime.    Historical Provider, MD  Multiple Vitamin (MULTIVITAMIN) tablet Take 1 tablet by mouth daily.    Historical Provider, MD  Multiple Vitamins-Minerals (PRESERVISION AREDS 2) CAPS Take 1 capsule by mouth 2 (two) times daily.    Historical Provider, MD  omeprazole (PRILOSEC) 20 MG capsule Take 20 mg by mouth daily.    Historical Provider, MD  oxybutynin (DITROPAN) 5 MG tablet Take 5 mg by mouth 2 (two) times  a week. Take 1 tablet twice a week on Tuesday and Saturday.    Historical Provider, MD  polyethylene glycol (MIRALAX / GLYCOLAX) packet Take 17 g by mouth daily.    Historical Provider, MD   Dg Chest Portable 1 View  11/02/2014  CLINICAL DATA:  Preoperative image EXAM: PORTABLE CHEST 1 VIEW COMPARISON:  None. FINDINGS: Moderate to severe cardiac enlargement. Mild vascular congestion. Mild chronic appearing bronchitic change. No evidence of pulmonary edema or consolidation. Limited evaluation of the left lower lobe due to patient rotation and cardiac enlargement.  IMPRESSION: Significant cardiac enlargement. With vascular congestion with no evidence of acute findings. Electronically Signed   By: Esperanza Heiraymond  Rubner M.D.   On: 11/02/2014 16:48   Dg Hip Unilat With Pelvis 2-3 Views Left  11/02/2014  CLINICAL DATA:  Left hip pain after falling yesterday. Initial encounter. EXAM: DG HIP (WITH OR WITHOUT PELVIS) 2-3V LEFT COMPARISON:  Radiographs 10/24/2014. FINDINGS: The bones are demineralized. There is an acute, angulated and comminuted intertrochanteric left femur fracture. There is no evidence of dislocation or acute pelvic fracture. Postsurgical changes are present within the pelvis. There are scattered vascular calcifications. IMPRESSION: Acute, angulated and comminuted left intertrochanteric femur fracture. Electronically Signed   By: Carey BullocksWilliam  Veazey M.D.   On: 11/02/2014 16:48    Positive ROS: All other systems have been reviewed and were otherwise negative with the exception of those mentioned in the HPI and as above.  Physical Exam: General:  Alert, no acute distress Psychiatric:  Patient is not competent for consent, but has a normal mood and affect   Cardiovascular:  No pedal edema Respiratory:  No wheezing, non-labored breathing GI:  Abdomen is soft and non-tender Skin:  No lesions in the area of chief complaint Neurologic:  Sensation intact distally Lymphatic:  No axillary or cervical lymphadenopathy  Orthopedic Exam:  Orthopedic examination is limited to the left hip and lower extremity. The left lower extremities slightly shortened and externally rotated as compared to the right lower extremity. Skin inspection around her left hip is unremarkable. She has moderate tenderness to palpation over the lateral aspect of her hip. She has more significant pain with any attempted active or passive motion of the left hip. She is able to actively dorsiflex and plantarflex her toes and ankle. She has intact sensation to her left foot. Good capillary refill to  her left foot.  X-rays:  X-rays of her pelvis and left hip are available for review. These films demonstrate a varus angulated intertrochanteric fracture of her left hip with diffuse osteopenia. No significant degenerative changes are noted.  Assessment: Displaced intertrochanteric fracture left hip.  Plan: The treatment options are discussed with the patient, as well as with the patient's daughter by telephone, who is her power of attorney and healthcare proxy. The procedure of a trochanteric femoral nailing of the left intertrochanteric hip fracture has been discussed, as have the potential risks (including bleeding, infection, nerve and/or blood vessel injury, persistent or recurrent pain, malunion and/or nonunion, leg length inequality, need for further surgery, blood clots, strokes, heart attacks and/or arrhythmias, etc.) and benefits. The patient's daughter states her understanding and wishes to proceed. A telephone consent has been obtained and witnessed by the emergency room nurse caring for the patient.  Thank you for ask me to participate in the care of this pleasant elderly woman. I will be happy to follow her with you.   Maryagnes AmosJ. Jeffrey Poggi, MD  Beeper #:  [phone]  11/02/2014 5:20  PM

## 2014-11-02 NOTE — ED Provider Notes (Signed)
[facility] Emergency Department Provider Note  ____________________________________________  Time seen: Approximately 4:09 PM  I have reviewed the triage vital signs and the nursing notes.   HISTORY  Chief Complaint Leg Pain  Caveat-history of present illness and review of systems Limited secondary to the patient's dementia. All information obtained from staff/caregivers at [facility].  HPI Regina Bridges is a 81 y.o. female with dementia, hypertension, hyperlipidemia who presents for evaluation of persistent left thigh and hip pain limiting ambulation. According to staff at [facility], the patient had a flu shot in the left thigh approximately 2 weeks ago. Since that time she has had pain with difficulty ambulating. She was seen here on 10/24/2014 for similar complaints and had negative plain. That time and was discharged home. Staff at [facility] reports that since she was seen here on the 21st, she has had problems with ambulation, every time she stands up she needs to sit down and this is perceived to be because she is having pain in the left thigh. She had a witnessed mechanical fall yesterday when she landed on her bottom and right side. Not sustain any head injury. Staff reports that she has otherwise been in her usual state of health without illness including no cough, sneezing, runny nose, congestion, vomiting, diarrhea, fevers, chills.   Past Medical History  Diagnosis Date  . Hyperlipemia   . Dementia   . Alzheimer disease   . Macular degeneration   . Hypertension   . Uterine prolapse   . Urinary incontinence   . Mitral regurgitation   . Takatsuki syndrome   . Pulmonary hypertension [facility])     Patient Active Problem List   Diagnosis Date Noted  . Femur fracture, left (HCC) 11/02/2014    Past Surgical History  Procedure Laterality Date  . Appendectomy    . Cholecystectomy    . Cataract extraction    . Abdominal hysterectomy       Current Outpatient Rx  Name  Route  Sig  Dispense  Refill  . acetaminophen (TYLENOL) 500 MG tablet   Oral   Take 1,000 mg by mouth 2 (two) times daily.         Marland Kitchen aspirin EC 81 MG tablet   Oral   Take 81 mg by mouth daily.         . benazepril (LOTENSIN) 10 MG tablet   Oral   Take 10 mg by mouth daily.         . carvedilol (COREG) 6.25 MG tablet   Oral   Take 6.25 mg by mouth 2 (two) times daily with a meal.         . haloperidol (HALDOL) 0.5 MG tablet   Oral   Take 0.5 mg by mouth every 8 (eight) hours as needed for agitation.         Marland Kitchen ibuprofen (ADVIL,MOTRIN) 200 MG tablet   Oral   Take 400 mg by mouth 2 (two) times daily as needed.         . Melatonin 1 MG TABS   Oral   Take 5 mg by mouth at bedtime.         . Multiple Vitamin (MULTIVITAMIN) tablet   Oral   Take 1 tablet by mouth daily.         . Multiple Vitamins-Minerals (PRESERVISION AREDS 2) CAPS   Oral   Take 1 capsule by mouth 2 (two) times daily.         Marland Kitchen  omeprazole (PRILOSEC) 20 MG capsule   Oral   Take 20 mg by mouth daily.         Marland Kitchen oxybutynin (DITROPAN) 5 MG tablet   Oral   Take 5 mg by mouth 2 (two) times a week. Take 1 tablet twice a week on Tuesday and Saturday.         . polyethylene glycol (MIRALAX / GLYCOLAX) packet   Oral   Take 17 g by mouth daily.           Allergies Review of patient's allergies indicates no known allergies.  Family History  Problem Relation Age of Onset  . Pancreatic cancer Father     Social History Social History  Substance Use Topics  . Smoking status: Unknown If Ever Smoked  . Smokeless tobacco: None  . Alcohol Use: No    Review of Systems Constitutional: No fever/chills Gastrointestinal: no vomiting.  No diarrhea.  No constipation. Skin: Negative for rash.   Caveat-history of present illness and review of systems Limited secondary to the patient's dementia. All information obtained from staff/caregivers at [facility]  [facility]. ____________________________________________   PHYSICAL EXAM:  VITAL SIGNS: ED Triage Vitals  Enc Vitals Group     BP 11/02/14 1551 206/106 mmHg     Pulse Rate 11/02/14 1551 72     Resp 11/02/14 1551 16     Temp 11/02/14 1551 97.5 F (36.4 C)     Temp Source 11/02/14 1551 Oral     SpO2 11/02/14 1551 97 %     Weight 11/02/14 1551 140 lb (63.504 kg)     Height 11/02/14 1551  (1.6 m)     Head Cir --      Peak Flow --      Pain Score --      Pain Loc --      Pain Edu? --      Excl. in GC? --     Constitutional: Alert and severely demented, only intermittently cooperative with exam. Lying in bed with eyes closed, in no acute distress. Eyes: Conjunctivae are normal. PERRL. EOMI. Head: Atraumatic. Nose: No congestion/rhinnorhea. Mouth/Throat: Mucous membranes are moist.  Oropharynx non-erythematous. Neck: No stridor.   Cardiovascular: Normal rate, regular rhythm. Grossly normal heart sounds.  Good peripheral circulation. Respiratory: Normal respiratory effort.  No retractions. Lungs CTAB. Gastrointestinal: Soft and nontender. No distention. No abdominal bruits. No CVA tenderness. Genitourinary: deferred Musculoskeletal: The patient does have evidence of healing ecchymosis in the left posterior upper thigh and does have pain with movement and the area is tender to palpation. The left leg is slightly increase in symptoms or circumference when compared to the right of there is no pitting edema. 2+ right and left DP pulses bilaterally and she wiggles the toes. Neurologic:  Normal speech and language. Moves all extremities spontaneously and equally but does not cooperate with formal neurological testing. Skin:  Skin is warm, dry and intact. No rash noted. Psychiatric: Mood and affect are normal given dementia history. Speech and behavior are appropriate given dementia history.  ____________________________________________   LABS (all labs ordered are listed, but only  abnormal results are displayed)  Labs Reviewed  CBC WITH DIFFERENTIAL/PLATELET - Abnormal; Notable for the following:    WBC 12.2 (*)    Neutro Abs 9.3 (*)    Monocytes Absolute 1.3 (*)    All other components within normal limits  BASIC METABOLIC PANEL - Abnormal; Notable for the following:    Glucose, Bld 130 (*)  BUN 33 (*)    GFR calc non Af Amer 58 (*)    All other components within normal limits  PROTIME-INR  APTT   ____________________________________________  EKG  ED ECG REPORT I, Gayla DossGayle, Latravis Grine A, the attending physician, personally viewed and interpreted this ECG.   Date: 11/02/2014  EKG Time: 16:57  Rate: 70  Rhythm: normal sinus rhythm with sinus arrhythmia  Axis: normal  Intervals: bifascicular block  ST&T Change: No acute ST elevation  ____________________________________________  RADIOLOGY  Left hip xray  Venous doppler us of the left leg ____________________________________________   PROCEDURES  Procedure(s) performed: None  Critical Care performed: No  ____________________________________________   INITIAL IMPRESSION / ASSESSMENT AND PLAN / ED COURSE  Pertinent labs & imaging results that were available during my care of the patient were reviewed by me and considered in my medical decision making (see chart for details).  Regina Bridges is a 81 y.o. female with dementia, hypertension, hyperlipidemia who presents for evaluation of persistent left thigh and hip pain limiting ambulation. Exam, she is demented but in no acute distress. She has asymptomatic hypertension which she frequently has on presentation to the emergency department. The remainder of her vital signs are stable, she is afebrile. There is evidence of an ecchymosis in the left posterior thigh and the entire left lower extremity is slightly swollen when compared to the right therefore will obtain venous Doppler ultrasound to rule out DVT and we'll also obtain plain films of Left hip  given her pain and her fall yesterday. We'll treat her pain. Reassess for disposition.  ----------------------------------------- 5:05 PM on 11/02/2014 -----------------------------------------  Plain films concerning for left intertrochanteric femur fracture which explains her pain and inability to ambulate as well as the swelling her left leg. We'll not pursue venous Doppler ultrasound. I discussed the case with Dr. Joice LoftsPoggi of orthopedic surgery who will evaluate the patient. case with hospitalist for admission. ____________________________________________   FINAL CLINICAL IMPRESSION(S) / ED DIAGNOSES  Final diagnoses:  Left thigh pain  Closed fracture of left femur, unspecified fracture morphology, unspecified portion of femur, initial encounter (HCC)      Gayla DossEryka A Latora Quarry, MD 11/02/14 2344

## 2014-11-02 NOTE — ED Notes (Addendum)
Dr. Joice LoftsPoggi spoke with POA Alan Ripperlaire re: surgery and consent for surgery obtained with myself as witness with Dr. Joice LoftsPoggi.

## 2014-11-02 NOTE — ED Notes (Signed)
Daughter Alan RipperClaire notified of patient's room number.

## 2014-11-02 NOTE — ED Notes (Signed)
Dr. Poggi at bedside. °

## 2014-11-02 NOTE — Progress Notes (Signed)

## 2014-11-03 ENCOUNTER — Inpatient Hospital Stay: Payer: Medicare Other | Admitting: Anesthesiology

## 2014-11-03 ENCOUNTER — Encounter
Admission: EM | Disposition: A | Discharge status: Skilled Nursing Facility | Payer: Self-pay | Source: Home / Self Care | Attending: Internal Medicine

## 2014-11-03 ENCOUNTER — Inpatient Hospital Stay: Payer: Medicare Other

## 2014-11-03 ENCOUNTER — Encounter: Payer: Self-pay | Admitting: *Deleted

## 2014-11-03 HISTORY — PX: INTRAMEDULLARY (IM) NAIL INTERTROCHANTERIC: SHX5875

## 2014-11-03 LAB — HEMOGLOBIN: Hemoglobin: 12.5 g/dL (ref 12.0–16.0)

## 2014-11-03 SURGERY — FIXATION, FRACTURE, INTERTROCHANTERIC, WITH INTRAMEDULLARY ROD
Anesthesia: Spinal | Laterality: Left

## 2014-11-03 MED ORDER — ACETAMINOPHEN 650 MG RE SUPP: 650.0000 mg | Freq: Four times a day (QID) | RECTAL | Status: DC | PRN

## 2014-11-03 MED ORDER — PROPOFOL 500 MG/50ML IV EMUL
INTRAVENOUS | Status: DC | PRN
  Administered 2014-11-03: 35 ug/kg/min via INTRAVENOUS

## 2014-11-03 MED ORDER — BISACODYL 10 MG RE SUPP
10.0000 mg | Freq: Every day | RECTAL | Status: DC | PRN
  Administered 2014-11-06: 10 mg via RECTAL
  Filled 2014-11-03: qty 1

## 2014-11-03 MED ORDER — OXYCODONE HCL 5 MG PO TABS
5.0000 mg | ORAL_TABLET | ORAL | Status: DC | PRN
  Administered 2014-11-03 – 2014-11-04 (×3): 5 mg via ORAL
  Filled 2014-11-03 (×3): qty 1

## 2014-11-03 MED ORDER — FENTANYL CITRATE (PF) 100 MCG/2ML IJ SOLN: 25.0000 ug | INTRAMUSCULAR | Status: DC | PRN

## 2014-11-03 MED ORDER — PHENYLEPHRINE HCL 10 MG/ML IJ SOLN
INTRAMUSCULAR | Status: DC | PRN
  Administered 2014-11-03 (×6): 100 ug via INTRAVENOUS

## 2014-11-03 MED ORDER — ONDANSETRON HCL 4 MG PO TABS: 4.0000 mg | ORAL_TABLET | Freq: Four times a day (QID) | ORAL | Status: DC | PRN

## 2014-11-03 MED ORDER — EPHEDRINE SULFATE 50 MG/ML IJ SOLN
INTRAMUSCULAR | Status: DC | PRN
  Administered 2014-11-03: 5 mg via INTRAVENOUS
  Administered 2014-11-03 (×2): 10 mg via INTRAVENOUS

## 2014-11-03 MED ORDER — MAGNESIUM HYDROXIDE 400 MG/5ML PO SUSP
30.0000 mL | Freq: Every day | ORAL | Status: DC | PRN
  Administered 2014-11-06: 30 mL via ORAL
  Filled 2014-11-03: qty 30

## 2014-11-03 MED ORDER — ENOXAPARIN SODIUM 40 MG/0.4ML ~~LOC~~ SOLN
40.0000 mg | SUBCUTANEOUS | Status: DC
  Administered 2014-11-04 – 2014-11-06 (×3): 40 mg via SUBCUTANEOUS
  Filled 2014-11-03 (×3): qty 0.4

## 2014-11-03 MED ORDER — LACTATED RINGERS IV SOLN
INTRAVENOUS | Status: DC | PRN
  Administered 2014-11-03: 13:00:00 via INTRAVENOUS

## 2014-11-03 MED ORDER — KCL IN DEXTROSE-NACL 20-5-0.9 MEQ/L-%-% IV SOLN
INTRAVENOUS | Status: DC
  Administered 2014-11-03 – 2014-11-05 (×2): via INTRAVENOUS
  Filled 2014-11-03 (×8): qty 1000

## 2014-11-03 MED ORDER — METOCLOPRAMIDE HCL 5 MG/ML IJ SOLN: 5.0000 mg | Freq: Three times a day (TID) | INTRAMUSCULAR | Status: DC | PRN

## 2014-11-03 MED ORDER — DOCUSATE SODIUM 100 MG PO CAPS
100.0000 mg | ORAL_CAPSULE | Freq: Two times a day (BID) | ORAL | Status: DC
  Administered 2014-11-03 – 2014-11-06 (×3): 100 mg via ORAL
  Filled 2014-11-03 (×3): qty 1

## 2014-11-03 MED ORDER — FENTANYL CITRATE (PF) 100 MCG/2ML IJ SOLN
INTRAMUSCULAR | Status: DC | PRN
  Administered 2014-11-03: 25 ug via INTRAVENOUS

## 2014-11-03 MED ORDER — ONDANSETRON HCL 4 MG/2ML IJ SOLN: 4.0000 mg | Freq: Once | INTRAMUSCULAR | Status: DC | PRN

## 2014-11-03 MED ORDER — FLEET ENEMA 7-19 GM/118ML RE ENEM: 1.0000 | ENEMA | Freq: Once | RECTAL | Status: DC | PRN

## 2014-11-03 MED ORDER — HYDROMORPHONE HCL 1 MG/ML IJ SOLN
0.2500 mg | INTRAMUSCULAR | Status: DC | PRN
  Administered 2014-11-03: 0.25 mg via INTRAVENOUS
  Filled 2014-11-03: qty 1

## 2014-11-03 MED ORDER — CEFAZOLIN SODIUM-DEXTROSE 2-3 GM-% IV SOLR
2.0000 g | Freq: Four times a day (QID) | INTRAVENOUS | Status: AC
  Administered 2014-11-03 – 2014-11-04 (×3): 2 g via INTRAVENOUS
  Filled 2014-11-03 (×3): qty 50

## 2014-11-03 MED ORDER — BUPIVACAINE-EPINEPHRINE (PF) 0.5% -1:200000 IJ SOLN
INTRAMUSCULAR | Status: AC
  Filled 2014-11-03: qty 30

## 2014-11-03 MED ORDER — NEOMYCIN-POLYMYXIN B GU 40-200000 IR SOLN
Status: DC | PRN
  Administered 2014-11-03: 2 mL

## 2014-11-03 MED ORDER — BUPIVACAINE-EPINEPHRINE (PF) 0.5% -1:200000 IJ SOLN
INTRAMUSCULAR | Status: DC | PRN
  Administered 2014-11-03: 20 mL

## 2014-11-03 MED ORDER — PROPOFOL 500 MG/50ML IV EMUL: INTRAVENOUS | Status: DC | PRN

## 2014-11-03 MED ORDER — PROPOFOL 10 MG/ML IV BOLUS
INTRAVENOUS | Status: DC | PRN
  Administered 2014-11-03: 30 mg via INTRAVENOUS

## 2014-11-03 MED ORDER — ACETAMINOPHEN 325 MG PO TABS
650.0000 mg | ORAL_TABLET | Freq: Four times a day (QID) | ORAL | Status: DC | PRN
  Administered 2014-11-03 – 2014-11-06 (×3): 650 mg via ORAL
  Filled 2014-11-03 (×3): qty 2

## 2014-11-03 MED ORDER — ONDANSETRON HCL 4 MG/2ML IJ SOLN: 4.0000 mg | Freq: Four times a day (QID) | INTRAMUSCULAR | Status: DC | PRN

## 2014-11-03 MED ORDER — DIPHENHYDRAMINE HCL 12.5 MG/5ML PO ELIX: 12.5000 mg | ORAL_SOLUTION | ORAL | Status: DC | PRN

## 2014-11-03 MED ORDER — HALOPERIDOL LACTATE 5 MG/ML IJ SOLN
1.0000 mg | Freq: Once | INTRAMUSCULAR | Status: DC | PRN
  Administered 2014-11-03 – 2014-11-05 (×2): 1 mg via INTRAVENOUS
  Filled 2014-11-03 (×4): qty 1

## 2014-11-03 MED ORDER — METOCLOPRAMIDE HCL 10 MG PO TABS: 5.0000 mg | ORAL_TABLET | Freq: Three times a day (TID) | ORAL | Status: DC | PRN

## 2014-11-03 MED ORDER — MORPHINE SULFATE (PF) 2 MG/ML IV SOLN: 2.0000 mg | INTRAVENOUS | Status: DC | PRN

## 2014-11-03 SURGICAL SUPPLY — 39 items
BIT DRILL 4.3MMS DISTAL GRDTED (BIT) ×1 IMPLANT
BNDG COHESIVE 4X5 TAN STRL (GAUZE/BANDAGES/DRESSINGS) ×3 IMPLANT
BNDG COHESIVE 6X5 TAN STRL LF (GAUZE/BANDAGES/DRESSINGS) ×6 IMPLANT
CANISTER SUCT 1200ML W/VALVE (MISCELLANEOUS) ×3 IMPLANT
CHLORAPREP W/TINT 26ML (MISCELLANEOUS) ×6 IMPLANT
CORTICAL BONE SCR 5.0MM X 48MM (Screw) ×3 IMPLANT
DRAPE C-ARMOR (DRAPES) ×3 IMPLANT
DRAPE SHEET LG 3/4 BI-LAMINATE (DRAPES) ×3 IMPLANT
DRAPE SURG 17X11 SM STRL (DRAPES) IMPLANT
DRAPE U-SHAPE 47X51 STRL (DRAPES) IMPLANT
DRILL 4.3MMS DISTAL GRADUATED (BIT) ×3
ELECT CAUTERY BLADE 6.4 (BLADE) ×3 IMPLANT
GAUZE SPONGE 4X4 12PLY STRL (GAUZE/BANDAGES/DRESSINGS) ×3 IMPLANT
GLOVE BIO SURGEON STRL SZ8 (GLOVE) ×9 IMPLANT
GLOVE INDICATOR 8.0 STRL GRN (GLOVE) ×6 IMPLANT
GOWN STRL REUS W/ TWL LRG LVL3 (GOWN DISPOSABLE) ×1 IMPLANT
GOWN STRL REUS W/ TWL XL LVL3 (GOWN DISPOSABLE) ×1 IMPLANT
GOWN STRL REUS W/TWL LRG LVL3 (GOWN DISPOSABLE) ×2
GOWN STRL REUS W/TWL XL LVL3 (GOWN DISPOSABLE) ×2
GUIDEPIN VERSANAIL DSP 3.2X444 ×3 IMPLANT
GUIDEWIRE BALL NOSE 80CM (WIRE) ×3 IMPLANT
HFN LH 130 DEG 11MM X 380MM (Orthopedic Implant) ×3 IMPLANT
HIP FRAC NAIL LAG SCR 10.5X100 (Orthopedic Implant) ×2 IMPLANT
MAT BLUE FLOOR 46X72 FLO (MISCELLANEOUS) ×3 IMPLANT
NEEDLE FILTER BLUNT 18X 1/2SAF (NEEDLE) ×2
NEEDLE FILTER BLUNT 18X1 1/2 (NEEDLE) ×1 IMPLANT
NEEDLE HYPO 25X1 1.5 SAFETY (NEEDLE) ×3 IMPLANT
NS IRRIG 500ML POUR BTL (IV SOLUTION) ×3 IMPLANT
PACK HIP COMPR (MISCELLANEOUS) ×3 IMPLANT
PAD GROUND ADULT SPLIT (MISCELLANEOUS) ×3 IMPLANT
SCREW CANN THRD AFF 10.5X100 (Orthopedic Implant) ×1 IMPLANT
SCREW CORTICL BON 5.0MM X 48MM (Screw) ×1 IMPLANT
SCREWDRIVER HEX TIP 3.5MM (MISCELLANEOUS) ×3 IMPLANT
STAPLER SKIN PROX 35W (STAPLE) ×3 IMPLANT
STRAP SAFETY BODY (MISCELLANEOUS) ×3 IMPLANT
SUT VIC AB 1 CT1 36 (SUTURE) ×3 IMPLANT
SUT VIC AB 2-0 CT1 (SUTURE) ×3 IMPLANT
SYRINGE 10CC LL (SYRINGE) ×6 IMPLANT
TAPE MICROFOAM 4IN (TAPE) ×3 IMPLANT

## 2014-11-03 NOTE — Progress Notes (Signed)
Initial Nutrition Assessment   INTERVENTION:   Coordination of Care: await diet progression as medically able s/p surgery today Medical Food Supplement Therapy: will recommend once diet order able to be advanced   NUTRITION DIAGNOSIS:   Inadequate oral intake related to inability to eat as evidenced by NPO status.  GOAL:   Patient will meet greater than or equal to 90% of their needs  MONITOR:    (Energy Intake, Anthropometrics, Digestive System)  REASON FOR ASSESSMENT:   Malnutrition Screening Tool    ASSESSMENT:   Pt admitted with left hip fracture. [facility] confused on visit. Pt scheduled for surgery today 11/03/2014.  Past Medical History  Diagnosis Date  . Hyperlipemia   . Dementia   . Alzheimer disease   . Macular degeneration   . Hypertension   . Uterine prolapse   . Urinary incontinence   . Mitral regurgitation   . Takatsuki syndrome   . Pulmonary hypertension (HCC)      Diet Order:  Diet NPO time specified Except for: Sips with Meds    Current Nutrition: Pt NPO  Food/Nutrition-Related History: Unable to clarify with pt on visit, as pt very confused.    Scheduled Medications:  . benazepril  20 mg Oral Daily  . carvedilol  6.25 mg Oral BID WC  . docusate sodium  100 mg Oral BID  . oxybutynin  5 mg Oral Once per day on Mon Thu  . pantoprazole  40 mg Oral QAC breakfast  . polyethylene glycol  17 g Oral Daily    Electrolyte/Renal Profile and Glucose Profile:   Recent Labs Lab 11/02/14 1643  NA 138  K 3.6  CL 105  CO2 28  BUN 33*  CREATININE 0.82  CALCIUM 9.0  GLUCOSE 130*   Protein Profile: No results for input(s): ALBUMIN in the last 168 hours.  Gastrointestinal Profile: Last BM: unknown   Nutrition-Focused Physical Exam Findings:  Unable to complete Nutrition-Focused physical exam at this time.    Weight Change: Per [facility] weight relatively stable however may have lost 5 lbs in 10 days  (4% weight loss) No family present on visit   Skin:  Reviewed, no issues   Height:   Ht Readings from Last 1 Encounters:  11/02/14 5\' 3"  (1.6 m)    Weight:   Wt Readings from Last 1 Encounters:  11/03/14 135 lb 9.6 oz (61.508 kg)    Wt Readings from Last 10 Encounters:  11/03/14 135 lb 9.6 oz (61.508 kg)  10/24/14 140 lb 8 oz (63.73 kg)  09/16/14 138 lb 12.8 oz (62.959 kg)    BMI:  Body mass index is 24.03 kg/(m^2).  Estimated Nutritional Needs:   Kcal:  BEE: 970kcals, TEE: (IF 1.2-1.4)(AF 1.2) 1396-1630kcals  Protein:  62-73g protein (1.0-1.2g/kg)  Fluid:  1540-183948mL of fluid (25-3630mL/kg)  EDUCATION NEEDS:   Education needs no appropriate at this time   MODERATE Care Level  Leda QuailAllyson Petronella Shuford, RD, LDN Pager [phone]

## 2014-11-03 NOTE — NC FL2 (Signed)
[place] MEDICAID FL2 LEVEL OF CARE SCREENING TOOL     IDENTIFICATION  Patient Name: Regina Bridges Birthdate: August 18, 1915 Sex: female Admission Date (Current Location): 11/02/2014  [place] Number:  [facility] )   Facility and Address:  [facility], [address] [place], [place] [address]      Provider Number: [id number])  Attending Physician Name and Address:  Adrian Saran, MD  Relative Name and Phone Number:       Current Level of Care: Hospital Recommended Level of Care: Skilled Nursing Facility Prior Approval Number:    Date Approved/Denied:   PASRR Number:  [id number])  Discharge Plan: SNF    Current Diagnoses: Patient Active Problem List   Diagnosis Date Noted  . Femur fracture, left (HCC) 11/02/2014    Orientation ACTIVITIES/SOCIAL BLADDER RESPIRATION    Self  Passive Incontinent, Indwelling catheter O2 (As needed) (Oxygen 2 Liters )  BEHAVIORAL SYMPTOMS/MOOD NEUROLOGICAL BOWEL NUTRITION STATUS   (none )  (none) Continent Diet (NPO for surgery )  PHYSICIAN VISITS COMMUNICATION OF NEEDS Height & Weight Skin  30 days Verbally  (160 cm) 135 lbs. Surgical wounds (Incision: Left Hip. )          AMBULATORY STATUS RESPIRATION    Assist extensive O2 (As needed) (Oxygen 2 Liters )      Personal Care Assistance Level of Assistance  Bathing, Feeding, Dressing Bathing Assistance: Limited assistance Feeding assistance: Independent Dressing Assistance: Limited assistance      Functional Limitations Info   (none)             SPECIAL CARE FACTORS FREQUENCY  PT (By licensed PT), OT (By licensed OT)     PT Frequency:  (5) OT Frequency:  (5)           Additional Factors Info  Code Status Code Status Info:  (DNR )             Current Medications (11/03/2014): Current Facility-Administered Medications  Medication Dose Route Frequency Provider Last Rate Last Dose  . acetaminophen  (TYLENOL) tablet 650 mg  650 mg Oral Q6H PRN Milagros Loll, MD       Or  . acetaminophen (TYLENOL) suppository 650 mg  650 mg Rectal Q6H PRN Srikar Sudini, MD      . albuterol (PROVENTIL) (2.5 MG/3ML) 0.083% nebulizer solution 2.5 mg  2.5 mg Nebulization Q2H PRN Srikar Sudini, MD      . benazepril (LOTENSIN) tablet 20 mg  20 mg Oral Daily Srikar Sudini, MD      . bisacodyl (DULCOLAX) EC tablet 5 mg  5 mg Oral Daily PRN Srikar Sudini, MD      . carvedilol (COREG) tablet 6.25 mg  6.25 mg Oral BID WC Srikar Sudini, MD   6.25 mg at 11/03/14 1102  . docusate sodium (COLACE) capsule 100 mg  100 mg Oral BID Milagros Loll, MD   100 mg at 11/02/14 2159  . fentaNYL (SUBLIMAZE) injection 25 mcg  25 mcg Intravenous Q5 min PRN Naomie Dean, MD      . fentaNYL (SUBLIMAZE) injection 25 mcg  25 mcg Intravenous Q5 min PRN Naomie Dean, MD      . haloperidol (HALDOL) tablet 0.5 mg  0.5 mg Oral Q8H PRN Srikar Sudini, MD      . hydrALAZINE (APRESOLINE) injection 10 mg  10 mg Intravenous Q6H PRN Srikar Sudini, MD      . HYDROcodone-acetaminophen (NORCO/VICODIN) 5-325 MG per tablet 1-2 tablet  1-2 tablet Oral Q4H PRN Srikar Sudini, MD      . morphine 2 MG/ML injection 2 mg  2 mg Intravenous Q4H PRN Milagros Loll, MD   2 mg at 11/03/14 1101  . ondansetron (ZOFRAN) tablet 4 mg  4 mg Oral Q6H PRN Milagros Loll, MD       Or  . ondansetron (ZOFRAN) injection 4 mg  4 mg Intravenous Q6H PRN Milagros Loll, MD   4 mg at 11/03/14 1113  . ondansetron (ZOFRAN) injection 4 mg  4 mg Intravenous Once PRN Naomie Dean, MD      . ondansetron [facility]) injection 4 mg  4 mg Intravenous Once PRN Naomie Dean, MD      . oxybutynin [facility]) tablet 5 mg  5 mg Oral Once per day on Mon Thu Milagros Loll, MD   5 mg at 11/03/14 1122  . pantoprazole (PROTONIX) EC tablet 40 mg  40 mg Oral QAC breakfast Milagros Loll, MD   40 mg at 11/03/14 1123  . polyethylene glycol (MIRALAX / GLYCOLAX) packet 17 g  17 g Oral Daily PRN  Srikar Sudini, MD      . polyethylene glycol (MIRALAX / GLYCOLAX) packet 17 g  17 g Oral Daily Milagros Loll, MD   17 g at 11/02/14 2041  . sodium phosphate (FLEET) 7-19 GM/118ML enema 1 enema  1 enema Rectal Once PRN Milagros Loll, MD       Do not use this list as official medication orders. Please verify with discharge summary.  Discharge Medications:   Medication List    ASK your doctor about these medications        acetaminophen 500 MG tablet  Commonly known as:  TYLENOL  Take 1,000 mg by mouth 3 (three) times daily.     aspirin EC 81 MG tablet  Take 81 mg by mouth daily.     benazepril 10 MG tablet  Commonly known as:  LOTENSIN  Take 10 mg by mouth daily.     carvedilol 6.25 MG tablet  Commonly known as:  COREG  Take 6.25 mg by mouth 2 (two) times daily. Take at 8am and 5pm.     haloperidol 0.5 MG tablet  Commonly known as:  HALDOL  Take 0.5 mg by mouth every 8 (eight) hours as needed for agitation (and anxiety.). *Do not exceed 3 doses in 24 hours*     ibuprofen 200 MG tablet  Commonly known as:  ADVIL,MOTRIN  Take 400 mg by mouth 2 (two) times daily as needed for mild pain or moderate pain.     Melatonin 5 MG Tabs  Take 5 mg by mouth at bedtime.     multivitamin tablet  Take 1 tablet by mouth daily.     omeprazole 20 MG capsule  Commonly known as:  PRILOSEC  Take 20 mg by mouth daily before breakfast.     oxybutynin 5 MG tablet  Commonly known as:  DITROPAN  Take 5 mg by mouth 2 (two) times a week. Take 1 tablet twice a week at bedtime on Tuesday and Saturday.     polyethylene glycol packet  Commonly known as:  MIRALAX / GLYCOLAX  Take 17 g by mouth daily. Mix with 8 oz liquid.     PRESERVISION AREDS 2 Caps  Take 1 capsule by mouth 2 (two) times daily.        Relevant Imaging Results:  Relevant Lab Results:  Recent Labs    Additional Information  (SSN:  [id number] Prophet)  Morgan, Rithvik Orcutt G, LCSW

## 2014-11-03 NOTE — Transfer of Care (Signed)
Immediate Anesthesia Transfer of Care Note  Patient: Regina Bridges  Procedure(s) Performed: Procedure(s): INTRAMEDULLARY (IM) NAIL INTERTROCHANTRIC (Left)  Patient Location: PACU  Anesthesia Type:Spinal  Level of Consciousness: sedated  Airway & Oxygen Therapy: Patient Spontanous Breathing and Patient connected to face mask oxygen  Post-op Assessment: Report given to RN and Post -op Vital signs reviewed and stable  Post vital signs: Reviewed and stable  Last Vitals:  Filed Vitals:   11/03/14 1450  BP: 101/53  Pulse: 64  Temp: 37.1 C  Resp: 18    Complications: No apparent anesthesia complications

## 2014-11-03 NOTE — Progress Notes (Signed)
Patient had surgery completed by Dr. Joice LoftsPoggi today with Left nailing. Prior to surgery patient was hypertensive and during surgery with Spinal patient had period of hypotension.  Upon returning to unit patient was starting to become hypertensive, gave blood pressure medications.  Patient denied any pain and did not appear to be presenting with pain until closer to shift change.  Patient became frustrated and agitated, gave pain medication but paged MD to change from Dilaudid to Morphine due to patients age.

## 2014-11-03 NOTE — Op Note (Signed)
11/02/2014 - 11/03/2014  2:50 PM  Patient:   Regina Bridges  Pre-Op Diagnosis:   Displaced intertrochanteric fracture left hip.  Post-Op Diagnosis:   Same.  Procedure:   Reduction and internal fixation of intertrochanteric left hip fracture with Biomet Affyxis TFN nail.  Surgeon:   Maryagnes Amos, MD  Assistant:   None  Anesthesia:   Spinal  Findings:   As above  Complications:   None  EBL:   50 cc  Fluids:   300 cc crystalloid  UOP:   250 cc  TT:   None  Drains:   None  Closure:   Staples  Implants:   Biomet Affyxis 11 x 380 mm TFN with a 100 mm lag screw and a 48 mm distal interlocking screw  Brief Clinical Note:   The patient is a 81 year old pleasantly demented female who lives in an assisted living facility. She apparently fell about 10 days ago but x-rays in the emergency room are unremarkable. She continued to complain of left hip pain. She fell again two nights ago and was brought back to the emergency room yesterday because of increased left hip pain and the inability to ambulate. X-rays in the emergency room yesterday demonstrated a displaced intertrochanteric fracture of her left hip. The patient has been cleared medically and presents at this time for reduction and internal fixation of the right hip fracture.  Procedure:   The patient was brought into the operating room. After adequate spinal anesthesia was obtained, the patient was lain in the supine position on the fracture table. The uninjured leg was placed in a flexed and abducted position while the injured lower extremity was placed in longitudinal traction. The fracture was reduced using longitudinal traction and internal rotation. The adequacy of reduction was verified fluoroscopically in AP and lateral projections and found to be near anatomic. The lateral aspects of the left hip and thigh were prepped with ChloraPrep solution before being draped sterilely. Preoperative antibiotics were administered. The  greater trochanter was identified fluoroscopically and an approximately 3 cm incision made about 2-3 fingerbreadths above the tip of the greater trochanter. The incision was carried down through the subcutaneous tissues to expose the gluteal fascia. This was split the length of the incision, providing access to the tip of the trochanter. Under fluoroscopic guidance, a guidewire was drilled through the tip of the trochanter into the proximal metaphysis to the level of the lesser trochanter. After verifying its position fluoroscopically in AP and lateral projections, it was overreamed with the initial reamer to the depth of the lesser trochanter. A guidewire was passed down through the femoral canal to the supracondylar region. The adequacy of guidewire position was verified fluoroscopically in AP and lateral projections before the length of the guidewire within the canal was measured and found to be 380 mm. It was overreamed sequentially using the flexible reamers, beginning with an 11 mm reamer and progressing to a 12.5 mm reamer. This provided good cortical chatter. The 11 x 380 mm Biomet Affyxis TFN rod was selected and advanced to the appropriate depth, as verified fluoroscopically. The guide system for the lag screw was positioned and advanced through an approximately 2 cm stab incision over the lateral aspect of the proximal femur. The guidewire was drilled up through the trochanteric femoral nail and into the femoral neck to rest within 5 mm of subchondral bone. After verifying its position in the femoral neck and head in both AP and lateral projections, the guidewire was measured  and found to be optimally replicated by a 100 mm lag screw. The guidewire was overreamed to the appropriate depth before the lag screw was inserted and advanced to the appropriate depth as verified fluoroscopically in AP and lateral projections. The locking screw was advanced, then backed off a quarter turn to set the lag screw.  Again the adequacy of hardware position and fracture reduction was verified fluoroscopically in AP and lateral projections and found to be excellent.  Attention was directed distally. Using the "perfect circle" technique, the leg and fluoroscopy machine were positioned appropriately. An approximate 1.5 cm stab incision was made over the skin at the appropriate point before the drill bit was advanced through the cortex and across the static hole of the nail. The appropriate length of the screw was determined before the 48 mm distal interlocking screw was positioned, then advanced and tightened securely. Again the adequacy of screw position was verified fluoroscopically in AP and lateral projections and found to be excellent.  The wounds were irrigated thoroughly with sterile saline solution before the deeper subcutaneous tissues were closed using 2-0 Vicryl interrupted sutures. The skin was closed using staples. Sterile bulky dressings were applied to all wounds before the patient was transferred back to his/her hospital bed. The patient was then transferred to the recovery room in satisfactory condition after tolerating the procedure well.

## 2014-11-03 NOTE — Progress Notes (Signed)
[facility] [place] [facility]   PATIENT NAME: Regina QuitterMarion Manke    MR#:  [id number]  DATE OF BIRTH:  05/07/1915  SUBJECTIVE:  Patiently pleasantly demented. No acute issues overnight  REVIEW OF SYSTEMS:    Review of Systems  Unable to perform ROS   Tolerating Diet: Nothing by mouth      DRUG ALLERGIES:  No Known Allergies  VITALS:  Blood pressure 160/80, pulse 85, temperature 98.7 F (37.1 C), temperature source Oral, resp. rate 18, height 5\' 3"  (1.6 m), weight 61.508 kg (135 lb 9.6 oz), SpO2 93 %.  PHYSICAL EXAMINATION:   Physical Exam  Constitutional: She is well-developed, well-nourished, and in no distress. No distress.  HENT:  Head: Normocephalic.  Eyes: No scleral icterus.  Neck: Normal range of motion. Neck supple. No JVD present. No tracheal deviation present.  Cardiovascular: Normal rate, regular rhythm and normal heart sounds.  Exam reveals no gallop and no friction rub.   No murmur heard. Pulmonary/Chest: Effort normal and breath sounds normal. No respiratory distress. She has no wheezes. She has no rales. She exhibits no tenderness.  Abdominal: Soft. Bowel sounds are normal. She exhibits no distension and no mass. There is no tenderness. There is no rebound and no guarding.  Musculoskeletal: Normal range of motion. She exhibits no edema.  Neurological: She is alert.  Skin: Skin is warm. No rash noted. No erythema.  Psychiatric: Affect normal.      LABORATORY PANEL:   CBC  Recent Labs Lab 11/02/14 1643 11/03/14 0409  WBC 12.2*  --   HGB 14.5 12.5  HCT 42.9  --   PLT 328  --    ------------------------------------------------------------------------------------------------------------------  Chemistries   Recent Labs Lab 11/02/14 1643  NA 138  K 3.6  CL 105  CO2 28  GLUCOSE 130*  BUN 33*  CREATININE 0.82  CALCIUM 9.0    ------------------------------------------------------------------------------------------------------------------  Cardiac Enzymes No results for input(s): TROPONINI in the last 168 hours. ------------------------------------------------------------------------------------------------------------------  RADIOLOGY:  Dg Chest Portable 1 View  11/02/2014  CLINICAL DATA:  Preoperative image EXAM: PORTABLE CHEST 1 VIEW COMPARISON:  None. FINDINGS: Moderate to severe cardiac enlargement. Mild vascular congestion. Mild chronic appearing bronchitic change. No evidence of pulmonary edema or consolidation. Limited evaluation of the left lower lobe due to patient rotation and cardiac enlargement. IMPRESSION: Significant cardiac enlargement. With vascular congestion with no evidence of acute findings. Electronically Signed   By: Esperanza Heiraymond  Rubner M.D.   On: 11/02/2014 16:48   Dg Hip Unilat With Pelvis 2-3 Views Left  11/02/2014  CLINICAL DATA:  Left hip pain after falling yesterday. Initial encounter. EXAM: DG HIP (WITH OR WITHOUT PELVIS) 2-3V LEFT COMPARISON:  Radiographs 10/24/2014. FINDINGS: The bones are demineralized. There is an acute, angulated and comminuted intertrochanteric left femur fracture. There is no evidence of dislocation or acute pelvic fracture. Postsurgical changes are present within the pelvis. There are scattered vascular calcifications. IMPRESSION: Acute, angulated and comminuted left intertrochanteric femur fracture. Electronically Signed   By: Carey BullocksWilliam  Veazey M.D.   On: 11/02/2014 16:48     ASSESSMENT AND PLAN:   81 year old female with dementia status post mechanical fall about a week ago who presented with ongoing left leg pain and another fall and found to have a left hip fracture.    1. Left intertrochanteric fracture: Patient is moderate risk for moderate risk procedure. Patient is to go to the OR today. Continue management as per orthopedic surgery.  2. Essential  hypertension: Blood  pressure is still slightly elevated due to pain. Continue when necessary pain medications and Coreg and Benzapril.  3. Dementia: Watch out for postop delirium. Continue Haldol when necessary  Management plans discussed with the patient's family and they are in agreement.  CODE STATUS: full  TOTAL TIME TAKING CARE OF THIS PATIENT: 30 minutes.     POSSIBLE D/C 3 days, DEPENDING ON CLINICAL CONDITION.   Ammar Moffatt M.D on 11/03/2014 at 12:53 PM  Between 7am to 6pm - Pager - [phone] After 6pm go to [email or web address] - password [email or web address] [facility]  [facility]  Office  [phone]  CC: Primary care physician; No PCP Per Patient  Note: This dictation was prepared with Dragon dictation along with smaller phrase technology. Any transcriptional errors that result from this process are unintentional.

## 2014-11-03 NOTE — Anesthesia Preprocedure Evaluation (Addendum)
Anesthesia Evaluation  Patient identified by MRN, date of birth, ID band Patient confused    Reviewed: Allergy & Precautions, NPO status , Patient's Chart, lab work & pertinent test results  History of Anesthesia Complications Negative for: history of anesthetic complications  Airway Mallampati: II       Dental  (+) Teeth Intact   Pulmonary neg pulmonary ROS,           Cardiovascular hypertension, Pt. on medications and Pt. on home beta blockers      Neuro/Psych negative neurological ROS     GI/Hepatic negative GI ROS, Neg liver ROS,   Endo/Other  negative endocrine ROS  Renal/GU negative Renal ROS     Musculoskeletal   Abdominal   Peds  Hematology negative hematology ROS (+)   Anesthesia Other Findings   Reproductive/Obstetrics                            Anesthesia Physical Anesthesia Plan  ASA: III  Anesthesia Plan: Spinal   Post-op Pain Management:    Induction: Intravenous  Airway Management Planned: Nasal Cannula  Additional Equipment:   Intra-op Plan:   Post-operative Plan:   Informed Consent: I have reviewed the patients History and Physical, chart, labs and discussed the procedure including the risks, benefits and alternatives for the proposed anesthesia with the patient or authorized representative who has indicated his/her understanding and acceptance.     Plan Discussed with:   Anesthesia Plan Comments:         Anesthesia Quick Evaluation

## 2014-11-03 NOTE — Clinical Social Work Placement (Signed)
   CLINICAL SOCIAL WORK PLACEMENT  NOTE  Date:  11/03/2014  Patient Details  Name: Melida QuitterMarion Rinke MRN: [id number] Date of Birth: 05/09/1915  Clinical Social Work is seeking post-discharge placement for this patient at the Skilled  Nursing Facility level of care (*CSW will initial, date and re-position this form in  chart as items are completed):  Yes   Patient/family provided with [place] Clinical Social Work Department's list of facilities offering this level of care within the geographic area requested by the patient (or if unable, by the patient's family).  Yes   Patient/family informed of their freedom to choose among providers that offer the needed level of care, that participate in Medicare, Medicaid or managed care program needed by the patient, have an available bed and are willing to accept the patient.  Yes   Patient/family informed of [facility] ownership interest in [facility] and [facility], as well as of the fact that they are under no obligation to receive care at these facilities.  PASRR submitted to EDS on 11/03/14     PASRR number received on 11/03/14     Existing PASRR number confirmed on       FL2 transmitted to all facilities in geographic area requested by pt/family on 11/03/14     FL2 transmitted to all facilities within larger geographic area on       Patient informed that his/her managed care company has contracts with or will negotiate with certain facilities, including the following:            Patient/family informed of bed offers received.  Patient chooses bed at       Physician recommends and patient chooses bed at      Patient to be transferred to   on  .  Patient to be transferred to facility by       Patient family notified on   of transfer.  Name of family member notified:        PHYSICIAN Please sign FL2     Additional Comment:    _______________________________________________ Haig ProphetMorgan, Hassell Patras G, LCSW 11/03/2014, 3:37  PM

## 2014-11-03 NOTE — Clinical Social Work Note (Signed)
Clinical Social Work Assessment  Patient Details  Name: Regina Bridges MRN: [id number] Date of Birth: 06-Aug-1915  Date of referral:  11/03/14               Reason for consult:  Facility Placement, Other (Comment Required) (From [facility] )                Permission sought to share information with:  Chartered certified accountant granted to share information::  Yes, Verbal Permission Granted  Name::      [place]::   [place]   Relationship::     Contact Information:     Housing/Transportation Living arrangements for the past 2 months:  [place] of Information:  [place], Adult Children Patient Interpreter Needed:  None Criminal Activity/Legal Involvement Pertinent to Current Situation/Hospitalization:  No - Comment as needed Significant Relationships:  Adult Children Lives with:  Facility Resident Do you feel safe going back to the place where you live?  Yes Need for family participation in patient care:  Yes (Comment)  Care giving concerns: Patient is a resident at [facility] care [facility].    Social Worker assessment / plan: Holiday representative (CSW) met with patient alone at bedside prior to surgery. Patient was oriented to self only. Patient appeared confused and did not answer questions appropriately. CSW met with patient's daughter Regina Bridges and provided SNF list. Lyndee Leo is agreeable to SNF search in [place]. CSW explained that patient's participation in PT after surgery will determine if her insurance will pay for rehab. CSW will continue to follow and assist as needed.   Employment status:  Retired Forensic scientist:  Medicare PT Recommendations:  Not assessed at this time Information / Referral to community resources:  [employer]  Patient/Family's Response to care: Daughter is agreeable to [employer].   Patient/Family's Understanding of and Emotional Response  to Diagnosis, Current Treatment, and Prognosis: Daughter was pleasant and thanked CSW for visit.   Emotional Assessment Appearance:  Appears stated age Attitude/Demeanor/Rapport:  Unable to Assess Affect (typically observed):  Unable to Assess Orientation:  Fluctuating Orientation (Suspected and/or reported Sundowners), Oriented to Self Alcohol / Substance use:  Not Applicable Psych involvement (Current and /or in the community):  No (Comment)  Discharge Needs  Concerns to be addressed:  Discharge Planning Concerns Readmission within the last 30 days:  No Current discharge risk:  Cognitively Impaired Barriers to Discharge:  Continued Medical Work up   Elwyn Reach 11/03/2014, 3:39 PM

## 2014-11-03 NOTE — Anesthesia Procedure Notes (Signed)
Spinal Patient location during procedure: OR Start time: 11/03/2014 1:10 PM End time: 11/03/2014 1:25 PM Staffing Performed by: anesthesiologist  Preanesthetic Checklist Completed: patient identified, site marked, surgical consent, pre-op evaluation, timeout performed, IV checked, risks and benefits discussed and monitors and equipment checked Spinal Block Patient position: sitting Prep: Betadine Patient monitoring: heart rate, continuous pulse ox, blood pressure and cardiac monitor Approach: left paramedian Location: L4-5 Injection technique: single-shot Needle Needle type: Whitacre and Introducer  Needle gauge: 24 G Needle length: 9 cm Additional Notes Negative paresthesia. Negative blood return. Positive free-flowing CSF. Expiration date of kit checked and confirmed. Patient tolerated procedure well, without complications.

## 2014-11-04 LAB — BASIC METABOLIC PANEL
ANION GAP: 7 (ref 5–15)
BUN: 28 mg/dL — ABNORMAL HIGH (ref 6–20)
CALCIUM: 8.7 mg/dL — AB (ref 8.9–10.3)
CHLORIDE: 102 mmol/L (ref 101–111)
CO2: 31 mmol/L (ref 22–32)
CREATININE: 0.74 mg/dL (ref 0.44–1.00)
GFR calc non Af Amer: >60 mL/min (ref >60)
Glucose, Bld: 145 mg/dL — ABNORMAL HIGH (ref 65–99)
Potassium: 3.9 mmol/L (ref 3.5–5.1)
SODIUM: 140 mmol/L (ref 135–145)

## 2014-11-04 LAB — CBC
HEMATOCRIT: 36.7 % (ref 35.0–47.0)
HEMOGLOBIN: 12.1 g/dL (ref 12.0–16.0)
MCH: 31.1 pg (ref 26.0–34.0)
MCHC: 33 g/dL (ref 32.0–36.0)
MCV: 94.4 fL (ref 80.0–100.0)
Platelets: 308 10*3/uL (ref 150–440)
RBC: 3.89 MIL/uL (ref 3.80–5.20)
RDW: 13.2 % (ref 11.5–14.5)
WBC: 12 10*3/uL — AB (ref 3.6–11.0)

## 2014-11-04 NOTE — Care Management Important Message (Signed)
Important Message  Patient Details  Name: Regina Bridges MRN: [id number] Date of Birth: 04/29/1915   Medicare Important Message Given:  Yes-second notification given    Olegario MessierKathy A Ouida Abeyta 11/04/2014, 9:40 AM

## 2014-11-04 NOTE — Care Management (Signed)
Patient POD 1 today; PT pending. No family currently in this room. Daughter's contact: Alan RipperClaire [phone] was written on patient room board. RNCM will continue to follow.

## 2014-11-04 NOTE — Progress Notes (Signed)
[facility] - [place] at [facility]   PATIENT NAME: Regina Bridges    MR#:  [id number]  DATE OF BIRTH:  Jun 29, 1915  SUBJECTIVE:  Patient's sleeping area no acute events overnight although she was slightly agitated after her procedure. REVIEW OF SYSTEMS:    Review of Systems  Unable to perform ROS   Tolerating Diet: Yes     DRUG ALLERGIES:  No Known Allergies  VITALS:  Blood pressure 158/68, pulse 87, temperature 98.3 F (36.8 C), temperature source Oral, resp. rate 19, height  (1.6 m), weight 62.687 kg (138 lb 3.2 oz), SpO2 93 %.  PHYSICAL EXAMINATION:   Physical Exam  Constitutional: She is well-developed, well-nourished, and in no distress. No distress.  HENT:  Head: Normocephalic.  Eyes: No scleral icterus.  Neck: Normal range of motion. Neck supple. No JVD present. No tracheal deviation present.  Cardiovascular: Normal rate, regular rhythm and normal heart sounds.  Exam reveals no gallop and no friction rub.   No murmur heard. Pulmonary/Chest: Effort normal and breath sounds normal. No respiratory distress. She has no wheezes. She has no rales. She exhibits no tenderness.  Abdominal: Soft. Bowel sounds are normal. She exhibits no distension and no mass. There is no tenderness. There is no rebound and no guarding.  Musculoskeletal: She exhibits no edema.  Neurological:  sleeping  Skin: Skin is warm. No rash noted. No erythema.  Psychiatric: Affect normal.      LABORATORY PANEL:   CBC  Recent Labs Lab 11/04/14 0619  WBC 12.0*  HGB 12.1  HCT 36.7  PLT 308   ------------------------------------------------------------------------------------------------------------------  Chemistries   Recent Labs Lab 11/04/14 0619  NA 140  K 3.9  CL 102  CO2 31  GLUCOSE 145*  BUN 28*  CREATININE 0.74  CALCIUM 8.7*    ------------------------------------------------------------------------------------------------------------------  Cardiac Enzymes No results for input(s): TROPONINI in the last 168 hours. ------------------------------------------------------------------------------------------------------------------  RADIOLOGY:  Dg Chest Portable 1 View  11/02/2014  CLINICAL DATA:  Preoperative image EXAM: PORTABLE CHEST 1 VIEW COMPARISON:  None. FINDINGS: Moderate to severe cardiac enlargement. Mild vascular congestion. Mild chronic appearing bronchitic change. No evidence of pulmonary edema or consolidation. Limited evaluation of the left lower lobe due to patient rotation and cardiac enlargement. IMPRESSION: Significant cardiac enlargement. With vascular congestion with no evidence of acute findings. Electronically Signed   By: Esperanza Heir M.D.   On: 11/02/2014 16:48   Dg C-arm 1-60 Min  11/03/2014  CLINICAL DATA:  Operative fixation of a left intertrochanteric fracture. EXAM: DG C-ARM 61-120 MIN; LEFT FEMUR 2 VIEWS COMPARISON:  11/02/2014. FINDINGS: Four Celsius arm views of the left femur demonstrate compression screw and rod fixation of the previously seen left intertrochanteric fracture. Near anatomic position and alignment of the fragments on these views. No additional fractures or dislocation seen. IMPRESSION: Hardware fixation of the previously seen left intertrochanteric fracture. Electronically Signed   By: Beckie Salts M.D.   On: 11/03/2014 14:40   Dg Hip Unilat With Pelvis 2-3 Views Left  11/02/2014  CLINICAL DATA:  Left hip pain after falling yesterday. Initial encounter. EXAM: DG HIP (WITH OR WITHOUT PELVIS) 2-3V LEFT COMPARISON:  Radiographs 10/24/2014. FINDINGS: The bones are demineralized. There is an acute, angulated and comminuted intertrochanteric left femur fracture. There is no evidence of dislocation or acute pelvic fracture. Postsurgical changes are present within the pelvis.  There are scattered vascular calcifications. IMPRESSION: Acute, angulated and comminuted left intertrochanteric femur fracture. Electronically Signed   By: Chrissie Noa  Purcell MoutonVeazey M.D.   On: 11/02/2014 16:48   Dg Femur Min 2 Views Left  11/03/2014  CLINICAL DATA:  Operative fixation of a left intertrochanteric fracture. EXAM: DG C-ARM 61-120 MIN; LEFT FEMUR 2 VIEWS COMPARISON:  11/02/2014. FINDINGS: Four Celsius arm views of the left femur demonstrate compression screw and rod fixation of the previously seen left intertrochanteric fracture. Near anatomic position and alignment of the fragments on these views. No additional fractures or dislocation seen. IMPRESSION: Hardware fixation of the previously seen left intertrochanteric fracture. Electronically Signed   By: Beckie SaltsSteven  Reid M.D.   On: 11/03/2014 14:40     ASSESSMENT AND PLAN:   81 year old female with dementia status post mechanical fall about a week ago who presented with ongoing left leg pain and another fall and found to have a left hip fracture.    1. Left intertrochanteric fracture: Patient is postoperative day #1 and is doing well postop. Hemoglobin remained stable. DVT prophylaxis as per orthopedics.  2. Essential hypertension:  Coreg and Benzapril.  3. Dementia:  Continue Haldol when necessary  Management plans discussed with the patient's family and they are in agreement.  CODE STATUS: full  TOTAL TIME TAKING CARE OF THIS PATIENT: 20 minutes    POSSIBLE D/C 2 days, DEPENDING ON CLINICAL CONDITION.   Johnnette Laux M.D on 11/04/2014 at 11:48 AM  Between 7am to 6pm - Pager - [phone] After 6pm go to [email or web address] - password [email or web address] [facility]  [facility]  Office  [phone]  CC: Primary care physician; No PCP Per Patient  Note: This dictation was prepared with Dragon dictation along with smaller phrase technology. Any transcriptional errors that result from this process are unintentional.

## 2014-11-04 NOTE — Progress Notes (Signed)
PT Cancellation Note  Patient Details Name: Regina Bridges MRN: [id number] DOB: 02/15/1915   Cancelled Treatment:    Reason Eval/Treat Not Completed: Patient's level of consciousness PT discussed status with RN, per RN patient has been asleep since previous PT session. Given her level of effort in previous PT session and level of awareness currently patient is likely more appropriate for QD status. Frequency recommendation updated on patient.   Kerin RansomPatrick A Shadonna Benedick, PT, DPT    11/04/2014, 3:28 PM

## 2014-11-04 NOTE — Progress Notes (Signed)
  Subjective: 1 Day Post-Op Procedure(s) (LRB): INTRAMEDULLARY (IM) NAIL INTERTROCHANTRIC (Left) Patient reports pain as unable to provide answer..   Patient is well, but severely demented. Plan is to go Skilled nursing facility after hospital stay. Negative for chest pain and shortness of breath Fever: no Gastrointestinal:Negative for nausea and vomiting  Objective: Vital signs in last 24 hours: Temp:  [97.2 F (36.2 C)-98.8 F (37.1 C)] 98.3 F (36.8 C) (11/01 0855) Pulse Rate:  [37-99] 87 (11/01 0855) Resp:  [13-20] 19 (11/01 0855) BP: (101-187)/(53-113) 158/68 mmHg (11/01 0855) SpO2:  [90 %-100 %] 93 % (11/01 0855) Weight:  [62.687 kg (138 lb 3.2 oz)] 62.687 kg (138 lb 3.2 oz) (11/01 0453)  Intake/Output from previous day:  Intake/Output Summary (Last 24 hours) at 11/04/14 1040 Last data filed at 11/04/14 0800  Gross per 24 hour  Intake 1726.25 ml  Output    925 ml  Net 801.25 ml    Intake/Output this shift:    Labs:  Recent Labs  11/02/14 1643 11/03/14 0409 11/04/14 0619  HGB 14.5 12.5 12.1    Recent Labs  11/02/14 1643 11/04/14 0619  WBC 12.2* 12.0*  RBC 4.55 3.89  HCT 42.9 36.7  PLT 328 308    Recent Labs  11/02/14 1643 11/04/14 0619  NA 138 140  K 3.6 3.9  CL 105 102  CO2 28 31  BUN 33* 28*  CREATININE 0.82 0.74  GLUCOSE 130* 145*  CALCIUM 9.0 8.7*    Recent Labs  11/02/14 1643  INR 0.94     EXAM General - Patient is Disorganized, Confused and Lacking Extremity - ABD soft Intact pulses distally Dorsiflexion/Plantar flexion intact Incision: scant drainage No cellulitis present Dressing/Incision - blood tinged drainage Motor Function - intact, moving foot and toes well on exam.   Pt has removed her bulky dressing over the inferior incision.  Placed 4x4 with tegaderm to hold this in place.  Past Medical History  Diagnosis Date  . Hyperlipemia   . Dementia   . Alzheimer disease   . Macular degeneration   . Hypertension    . Uterine prolapse   . Urinary incontinence   . Mitral regurgitation   . Takatsuki syndrome   . Pulmonary hypertension (HCC)     Assessment/Plan: 1 Day Post-Op Procedure(s) (LRB): INTRAMEDULLARY (IM) NAIL INTERTROCHANTRIC (Left) Active Problems:   Femur fracture, left (HCC)  Estimated body mass index is 24.49 kg/(m^2) as calculated from the following:   Height as of this encounter: 5\' 3"  (1.6 m).   Weight as of this encounter: 62.687 kg (138 lb 3.2 oz). Advance diet Up with therapy   Pt severely demented, removing IV's.  If patient removes IV again will debate on placing again. Pt has removed bulky dressing over her incision.  Will replace tomorrow with honeycomb dressing. Pt has not had a BM following surgery. Labs reviewed.  CBC and BMP ordered for tomorrow. Lungs clear to auscultation.  Abdomen soft with normal BS.  DVT Prophylaxis - Lovenox, Foot Pumps and TED hose Weight-Bearing as tolerated to left leg  J. Horris LatinoLance Malakai Schoenherr, PA-C [facility] 11/04/2014, 10:40 AM

## 2014-11-04 NOTE — Progress Notes (Addendum)
Clinical Social Worker (CSW) met with patient and her daughter Lyndee Leo was at bedside. Daughter requested CSW send referrals to [facility] and [facility] in [place]. CSW faxed referrals to requested facilities. [facility] offered a bed and daughter accepted. [facility] reported that they could transport patient on Thursday and will not require money up front. Plan is for patient to D/C Thursday 11/06/14 to [facility] in [place] [address], [place], [place] [address]. Daughter Lyndee Leo is aware of above. CSW will continue to follow and assist as needed.   Blima Rich, [place] [phone]

## 2014-11-04 NOTE — Evaluation (Signed)
Physical Therapy Evaluation Patient Details Name: Regina Bridges MRN: [id number] DOB: 08/31/1915 Today's Date: 11/04/2014   History of Present Illness  Patient is a 81 y/o female that presents from memory care unit for L femur fx. ORIF completed on 11/03/2014.   Clinical Impression  Patient admitted from memory care unit, and appears to have been ambulatory prior to this admission. She is unable to participate in this session secondary to cognitive impairments (which seem to be exacerbated by medications). She is unable to follow commands in this session or maintain sitting balance without max A x1. PT will attempt a second session this PM, however she is more likely to be appropriate for 1x per day sessions at this point given her level of participation. Patient is significantly different from her baseline and would benefit from SNF to work on mobility and transfers pending her ability to participate.     Follow Up Recommendations SNF (If she can participate in therapy)    Equipment Recommendations       Recommendations for Other Services       Precautions / Restrictions Precautions Precautions: Fall Restrictions Weight Bearing Restrictions: Yes LLE Weight Bearing: Weight bearing as tolerated      Mobility  Bed Mobility Overal bed mobility: +2 for physical assistance;Needs Assistance Bed Mobility: Supine to Sit;Sit to Supine     Supine to sit: +2 for safety/equipment;Total assist Sit to supine: +2 for physical assistance;Total assist   General bed mobility comments: Patient is unable to provide any effort given her cognitive status.   Transfers                    Ambulation/Gait                Stairs            Wheelchair Mobility    Modified Rankin (Stroke Patients Only)       Balance Overall balance assessment: Needs assistance   Sitting balance-Leahy Scale: Zero Sitting balance - Comments: Patient is unable to maintain balance whatsoever  without maximal assistance.  Postural control: Posterior lean;Left lateral lean                                   Pertinent Vitals/Pain Pain Assessment: Faces Faces Pain Scale: Hurts whole lot Pain Location: L Hip  Pain Intervention(s): Limited activity within patient's tolerance;Monitored during session (RN in room with PT to observe, meds administered during session)    Home Living Family/patient expects to be discharged to:: Skilled nursing facility                      Prior Function Level of Independence: Independent with assistive device(s)         Comments: Per ED notes she was ambulatory prior to this encounter? If so likely with RW.      Hand Dominance        Extremity/Trunk Assessment   Upper Extremity Assessment: Difficult to assess due to impaired cognition           Lower Extremity Assessment: Difficult to assess due to impaired cognition         Communication   Communication:  (Patient is unable to converse, she mutters throughout session)  Cognition Arousal/Alertness: Lethargic;Suspect due to medications Behavior During Therapy: Restless Overall Cognitive Status: Impaired/Different from baseline Area of Impairment: Attention;Orientation;Following commands;Safety/judgement;Awareness     Memory: Decreased  short-term memory;Decreased recall of precautions Following Commands:  (Does not follow commands) Safety/Judgement: Decreased awareness of safety;Decreased awareness of deficits     General Comments: Patient is extremely confused and unable to interact with PT. She grips on tightly to her gown and will not let go without PT unbuckling and sliding gown from under her fingers. Apparently she has picked off her surgical dressing and O2 was removed as well. Patient mumbled throughout this session, though none of this was coherent.     General Comments      Exercises        Assessment/Plan    PT Assessment Patient  needs continued PT services  PT Diagnosis Difficulty walking;Abnormality of gait;Generalized weakness;Altered mental status   PT Problem List Decreased strength;Pain;Decreased knowledge of use of DME;Decreased balance;Decreased activity tolerance;Decreased cognition;Decreased knowledge of precautions;Decreased mobility;Decreased safety awareness  PT Treatment Interventions DME instruction;Therapeutic activities;Therapeutic exercise;Gait training;Balance training;Neuromuscular re-education;Functional mobility training;Wheelchair mobility training   PT Goals (Current goals can be found in the Care Plan section) Acute Rehab PT Goals PT Goal Formulation: Patient unable to participate in goal setting Time For Goal Achievement: 11/18/14 Potential to Achieve Goals: Poor    Frequency BID (Can attempt second session this PM, if not tolerated will switch to QD)   Barriers to discharge        Co-evaluation               End of Session   Activity Tolerance: Other (comment) (Patient unable to follow any commands or participate in session) Patient left: in bed;with bed alarm set;with call bell/phone within reach Nurse Communication: Mobility status         Time: 1610-9604 PT Time Calculation (min) (ACUTE ONLY): 17 min   Charges:   PT Evaluation $Initial PT Evaluation Tier I: 1 Procedure     PT G Codes:       Kerin Ransom, PT, DPT    11/04/2014, 9:27 AM

## 2014-11-05 LAB — CBC
HEMATOCRIT: 32.7 % — AB (ref 35.0–47.0)
HEMOGLOBIN: 10.8 g/dL — AB (ref 12.0–16.0)
MCH: 31.6 pg (ref 26.0–34.0)
MCHC: 33.2 g/dL (ref 32.0–36.0)
MCV: 95.2 fL (ref 80.0–100.0)
Platelets: 263 10*3/uL (ref 150–440)
RBC: 3.43 MIL/uL — AB (ref 3.80–5.20)
RDW: 13 % (ref 11.5–14.5)
WBC: 9.9 10*3/uL (ref 3.6–11.0)

## 2014-11-05 LAB — BASIC METABOLIC PANEL
ANION GAP: 8 (ref 5–15)
BUN: 33 mg/dL — AB (ref 6–20)
CO2: 28 mmol/L (ref 22–32)
Calcium: 8.5 mg/dL — ABNORMAL LOW (ref 8.9–10.3)
Chloride: 106 mmol/L (ref 101–111)
Creatinine, Ser: 0.71 mg/dL (ref 0.44–1.00)
Glucose, Bld: 130 mg/dL — ABNORMAL HIGH (ref 65–99)
POTASSIUM: 4 mmol/L (ref 3.5–5.1)
SODIUM: 142 mmol/L (ref 135–145)

## 2014-11-05 MED ORDER — HALOPERIDOL LACTATE 5 MG/ML IJ SOLN
1.0000 mg | Freq: Four times a day (QID) | INTRAMUSCULAR | Status: DC | PRN
  Administered 2014-11-05: 1 mg via INTRAVENOUS

## 2014-11-05 MED ORDER — HALOPERIDOL LACTATE 5 MG/ML IJ SOLN
1.0000 mg | Freq: Once | INTRAMUSCULAR | Status: AC
  Administered 2014-11-05: 1 mg via INTRAVENOUS
  Filled 2014-11-05: qty 1

## 2014-11-05 NOTE — Progress Notes (Signed)
  Subjective: 2 Days Post-Op Procedure(s) (LRB): INTRAMEDULLARY (IM) NAIL INTERTROCHANTRIC (Left) Patient unable to give accurate report on pain.  Does not seem to be bothered by the incision. Patient is severely demented. Plan is to go Skilled nursing facility after hospital stay. Negative for chest pain and shortness of breath Fever: no Gastrointestinal:Negative for nausea and vomiting  Objective: Vital signs in last 24 hours: Temp:  [98 F (36.7 C)-100.6 F (38.1 C)] 98 F (36.7 C) (11/02 0459) Pulse Rate:  [69-87] 85 (11/02 0459) Resp:  [18-19] 18 (11/02 0459) BP: (116-158)/(54-126) 156/64 mmHg (11/02 0459) SpO2:  [92 %-93 %] 92 % (11/02 0459) Weight:  [63.141 kg (139 lb 3.2 oz)] 63.141 kg (139 lb 3.2 oz) (11/02 0500)  Intake/Output from previous day:  Intake/Output Summary (Last 24 hours) at 11/05/14 0752 Last data filed at 11/05/14 0507  Gross per 24 hour  Intake    610 ml  Output      0 ml  Net    610 ml    Intake/Output this shift:    Labs:  Recent Labs  11/02/14 1643 11/03/14 0409 11/04/14 0619 11/05/14 0629  HGB 14.5 12.5 12.1 10.8*    Recent Labs  11/04/14 0619 11/05/14 0629  WBC 12.0* 9.9  RBC 3.89 3.43*  HCT 36.7 32.7*  PLT 308 263    Recent Labs  11/04/14 0619 11/05/14 0629  NA 140 142  K 3.9 4.0  CL 102 106  CO2 31 28  BUN 28* 33*  CREATININE 0.74 0.71  GLUCOSE 145* 130*  CALCIUM 8.7* 8.5*    Recent Labs  11/02/14 1643  INR 0.94     EXAM General - Patient is Disorganized, Confused and Lacking Extremity - ABD soft Intact pulses distally Incision: dressing C/D/I No cellulitis present Dressing/Incision - clean, dry, no drainage Motor Function - intact, moving foot and toes well on exam.   Abdomen is soft on exam.  Normal BS.  Past Medical History  Diagnosis Date  . Hyperlipemia   . Dementia   . Alzheimer disease   . Macular degeneration   . Hypertension   . Uterine prolapse   . Urinary incontinence   . Mitral  regurgitation   . Takatsuki syndrome   . Pulmonary hypertension (HCC)     Assessment/Plan: 2 Days Post-Op Procedure(s) (LRB): INTRAMEDULLARY (IM) NAIL INTERTROCHANTRIC (Left) Active Problems:   Femur fracture, left (HCC)  Estimated body mass index is 24.66 kg/(m^2) as calculated from the following:   Height as of this encounter: 5\' 3"  (1.6 m).   Weight as of this encounter: 63.141 kg (139 lb 3.2 oz). Advance diet Up with therapy   Labs reviewed this AM.  Hg 10.8 this AM. Honeycomb dressing applied to the left hip is intact and shows no signs of infection. Pt has not had a BM following surgery. Labs reviewed.  CBC and BMP ordered for tomorrow morning. Lungs clear to auscultation.  DVT Prophylaxis - Lovenox, Foot Pumps and TED hose Weight-Bearing as tolerated to left leg  J. Horris LatinoLance Tyrian Peart, PA-C [facility] 11/05/2014, 7:52 AM

## 2014-11-05 NOTE — Plan of Care (Signed)
Problem: Education: Goal: Knowledge of [place] [facility] Education information/materials will improve Outcome: Not Progressing Pt confused   

## 2014-11-05 NOTE — Progress Notes (Signed)
Notified Dr Juliene PinaMody of pt agitation and attempting to pull out IV; told Dr had given 1mg  haldol, but not effective; Dr ordered additional 1mg  haldol IV, one time dose, to be given now

## 2014-11-05 NOTE — Progress Notes (Addendum)
Plan is for patient to D/C to [facility] in [facility] tomorrow. Per [name] admissions coordinator at [facility] patient is going to private room 212. Clinical Education officer, museum (CSW) met with daughter and discussed patient's slow progress with PT. CSW made daughter aware that if patient cannot participate in PT then Medicare will not pay for short term rehab for very long. Daughter reported that patient is paying privately at [facility] and can pay privately for SNF. CSW also discussed the possibility that patient may not be able to go back to ALF and may need long term care SNF. Daughter verbalized her understanding and reported that she is also open to hospice services. Daughter reported that there is a hospice unit at [facility]. CSW explained that daughter will have to discuss D/C plan from rehab with the social worker or D/C planner at [facility]. CSW will continue to follow and assist as needed.   Blima Rich, [place] [phone]

## 2014-11-05 NOTE — Progress Notes (Signed)
Physical Therapy Treatment Patient Details Name: Regina Bridges MRN: [id number] DOB: 1915-05-09 Today's Date: 11/05/2014    History of Present Illness Patient is a 81 y/o female that presents from memory care unit for L femur fx. ORIF completed on 11/03/2014.     PT Comments    Patient demonstrates increased alertness in this session and is able to follow commands sporadically, however she continues to remain quite disoriented. This appears to be closer to her baseline cognitively. She continues to require significant assistance in transfers, however she is able to sit at the edge of the bed with no assistance in this session. Patient attempted to stand x3 attempts with +2 assistance, which she was unable to complete secondary to LE weakness and pain. Skilled PT services continue to be indicated to improve her OOB mobility and gait skills.   Follow Up Recommendations  SNF     Equipment Recommendations       Recommendations for Other Services       Precautions / Restrictions Restrictions Weight Bearing Restrictions: Yes LLE Weight Bearing: Weight bearing as tolerated    Mobility  Bed Mobility Overal bed mobility: +2 for physical assistance;Needs Assistance Bed Mobility: Supine to Sit;Sit to Supine     Supine to sit: Total assist;+2 for physical assistance Sit to supine: +2 for physical assistance;Total assist   General bed mobility comments: Patient is unable to provide any effort given her cognitive status.   Transfers Overall transfer level: Needs assistance Equipment used: Rolling walker (2 wheeled) Transfers: Sit to/from Stand Sit to Stand: +2 physical assistance         General transfer comment: Attempted sit to stand transfer x 3 with max A x2 and RW. Unable to achieve full standing, though patient was able to provide improved effort on 2nd and third attempts. Very near standing, just unable to fully extend knees.  Ambulation/Gait                 Stairs            Wheelchair Mobility    Modified Rankin (Stroke Patients Only)       Balance Overall balance assessment: Needs assistance Sitting-balance support: Bilateral upper extremity supported Sitting balance-Leahy Scale: Fair Sitting balance - Comments: Patient is able to sit independently with no external assistance in this session.      Standing balance-Leahy Scale: Zero Standing balance comment: Unable to achieve standing with +2 assistance on 3 attempts.                     Cognition Arousal/Alertness: Awake/alert Behavior During Therapy: Restless Overall Cognitive Status: History of cognitive impairments - at baseline Area of Impairment: Attention;Orientation;Following commands;Safety/judgement;Awareness     Memory: Decreased short-term memory;Decreased recall of precautions Following Commands: Follows one step commands inconsistently Safety/Judgement: Decreased awareness of safety;Decreased awareness of deficits     General Comments: Patient is able to put together sentences during this session, though they are not pertinent to session. She responds to cuing intermittently, not oriented to situation or place but able to recall daughter's name Regina Bridges).     Exercises Other Exercises Other Exercises: Rolling performed with +2 assistance on and off bed pan.     General Comments        Pertinent Vitals/Pain Pain Assessment: Faces Faces Pain Scale: Hurts even more Pain Location: L Hip Pain Intervention(s): Limited activity within patient's tolerance;Monitored during session    Home Living  Prior Function            PT Goals (current goals can now be found in the care plan section) Acute Rehab PT Goals PT Goal Formulation: Patient unable to participate in goal setting Time For Goal Achievement: 11/18/14 Potential to Achieve Goals: Fair Progress towards PT goals: Progressing toward goals    Frequency  7X/week    PT  Plan Current plan remains appropriate    Co-evaluation             End of Session Equipment Utilized During Treatment: Gait belt Activity Tolerance: Patient tolerated treatment well Patient left: in bed;with bed alarm set;with call bell/phone within reach     Time: 1359-1423 PT Time Calculation (min) (ACUTE ONLY): 24 min  Charges:  $Therapeutic Activity: 23-37 mins                    G Codes:      Kerin RansomPatrick A McNamara, PT, DPT    11/05/2014, 3:47 PM

## 2014-11-05 NOTE — Progress Notes (Signed)
PT Cancellation Note  Patient Details Name: Melida QuitterMarion Ruane MRN: [id number] DOB: 08/17/1915   Cancelled Treatment:    Reason Eval/Treat Not Completed: Patient's level of consciousness. PT discussed case with RN, RN states patient has been very agitated and has received Haldol x 2 this morning. RN asks PT to wait for medications to take effect prior to therapy. PT will re-attempt at a later date/time.   Kerin RansomPatrick A McNamara, PT, DPT    11/05/2014, 9:47 AM

## 2014-11-05 NOTE — Plan of Care (Signed)
Problem: Consults Goal: Hip/Femur Fracture Patient Education See Patient Education Module for education specifics.  Outcome: Not Progressing Pt confused

## 2014-11-05 NOTE — Progress Notes (Signed)
[facility] Physicians - [place] at [facility]   PATIENT NAME: Regina Bridges    MR#:  [id number]  DATE OF BIRTH:  February 05, 1915  SUBJECTIVE:  Patient is demented and confused this am. She was agitated this morning and was given Haldol. REVIEW OF SYSTEMS:    Review of Systems  Unable to perform ROS   Tolerating Diet: Yes     DRUG ALLERGIES:  No Known Allergies  VITALS:  Blood pressure 163/88, pulse 83, temperature 97.9 F (36.6 C), temperature source Oral, resp. rate 16, height  (1.6 m), weight 63.141 kg (139 lb 3.2 oz), SpO2 90 %.  PHYSICAL EXAMINATION:   Physical Exam  Constitutional: She is well-developed, well-nourished, and in no distress. No distress.  HENT:  Head: Normocephalic.  Eyes: No scleral icterus.  Neck: Normal range of motion. Neck supple. No JVD present. No tracheal deviation present.  Cardiovascular: Normal rate, regular rhythm and normal heart sounds.  Exam reveals no gallop and no friction rub.   No murmur heard. Pulmonary/Chest: Effort normal and breath sounds normal. No respiratory distress. She has no wheezes. She has no rales. She exhibits no tenderness.  Abdominal: Soft. Bowel sounds are normal. She exhibits no distension and no mass. There is no tenderness. There is no rebound and no guarding.  Musculoskeletal: She exhibits no edema.  Neurological:  sleeping  Skin: Skin is warm. No rash noted. No erythema.  Psychiatric: Affect normal.      LABORATORY PANEL:   CBC  Recent Labs Lab 11/05/14 0629  WBC 9.9  HGB 10.8*  HCT 32.7*  PLT 263   ------------------------------------------------------------------------------------------------------------------  Chemistries   Recent Labs Lab 11/05/14 0629  NA 142  K 4.0  CL 106  CO2 28  GLUCOSE 130*  BUN 33*  CREATININE 0.71  CALCIUM 8.5*   ------------------------------------------------------------------------------------------------------------------  Cardiac  Enzymes No results for input(s): TROPONINI in the last 168 hours. ------------------------------------------------------------------------------------------------------------------  RADIOLOGY:  Dg C-arm 1-60 Min  11/03/2014  CLINICAL DATA:  Operative fixation of a left intertrochanteric fracture. EXAM: DG C-ARM 61-120 MIN; LEFT FEMUR 2 VIEWS COMPARISON:  11/02/2014. FINDINGS: Four Celsius arm views of the left femur demonstrate compression screw and rod fixation of the previously seen left intertrochanteric fracture. Near anatomic position and alignment of the fragments on these views. No additional fractures or dislocation seen. IMPRESSION: Hardware fixation of the previously seen left intertrochanteric fracture. Electronically Signed   By: Beckie Salts M.D.   On: 11/03/2014 14:40   Dg Femur Min 2 Views Left  11/03/2014  CLINICAL DATA:  Operative fixation of a left intertrochanteric fracture. EXAM: DG C-ARM 61-120 MIN; LEFT FEMUR 2 VIEWS COMPARISON:  11/02/2014. FINDINGS: Four Celsius arm views of the left femur demonstrate compression screw and rod fixation of the previously seen left intertrochanteric fracture. Near anatomic position and alignment of the fragments on these views. No additional fractures or dislocation seen. IMPRESSION: Hardware fixation of the previously seen left intertrochanteric fracture. Electronically Signed   By: Beckie Salts M.D.   On: 11/03/2014 14:40     ASSESSMENT AND PLAN:   81 year old female with dementia status post mechanical fall about a week ago who presented with ongoing left leg pain and another fall and found to have a left hip fracture.    1. Left intertrochanteric fracture: Patient is postoperative day #2. Hemoglobin remains stable without need for blood transfusion. DVT prophylaxis as per orthopedics.  2. Essential hypertension:  Coreg and Benzapril.  3. Dementia:  Continue Haldol when  necessary  Management plans discussed with the patient's  family and they are in agreement.  CODE STATUS: full  TOTAL TIME TAKING CARE OF THIS PATIENT: 20 minutes    POSSIBLE D/C tomorrow to skilled nursing facility DEPENDING ON CLINICAL CONDITION.   Wai Minotti M.D on 11/05/2014 at 11:43 AM  Between 7am to 6pm - Pager - [phone] After 6pm go to [email or web address] - password [email or web address] [facility]  [facility]  Office  [phone]  CC: Primary care physician; No PCP Per Patient  Note: This dictation was prepared with Dragon dictation along with smaller phrase technology. Any transcriptional errors that result from this process are unintentional.

## 2014-11-05 NOTE — Progress Notes (Signed)
Notified Dr Juliene PinaMody of pt becoming agitated again, and that pt only had oral meds ordered, and that pt was refusing to take any oral meds; Dr ordered haldol 1 mg IV q6 hrs PRN

## 2014-11-06 LAB — BASIC METABOLIC PANEL
Anion gap: 3 — ABNORMAL LOW (ref 5–15)
BUN: 26 mg/dL — AB (ref 6–20)
CHLORIDE: 108 mmol/L (ref 101–111)
CO2: 30 mmol/L (ref 22–32)
CREATININE: 0.59 mg/dL (ref 0.44–1.00)
Calcium: 8.2 mg/dL — ABNORMAL LOW (ref 8.9–10.3)
GFR calc Af Amer: >60 mL/min (ref >60)
GFR calc non Af Amer: >60 mL/min (ref >60)
GLUCOSE: 126 mg/dL — AB (ref 65–99)
Potassium: 4.2 mmol/L (ref 3.5–5.1)
Sodium: 141 mmol/L (ref 135–145)

## 2014-11-06 LAB — CBC
HCT: 31.4 % — ABNORMAL LOW (ref 35.0–47.0)
Hemoglobin: 10.4 g/dL — ABNORMAL LOW (ref 12.0–16.0)
MCH: 31.6 pg (ref 26.0–34.0)
MCHC: 33.2 g/dL (ref 32.0–36.0)
MCV: 95.1 fL (ref 80.0–100.0)
PLATELETS: 276 10*3/uL (ref 150–440)
RBC: 3.3 MIL/uL — ABNORMAL LOW (ref 3.80–5.20)
RDW: 13 % (ref 11.5–14.5)
WBC: 9.5 10*3/uL (ref 3.6–11.0)

## 2014-11-06 MED ORDER — HALOPERIDOL 0.5 MG PO TABS: 0.5000 mg | ORAL_TABLET | Freq: Three times a day (TID) | ORAL | Status: AC | PRN

## 2014-11-06 MED ORDER — MINERAL OIL RE ENEM
1.0000 | ENEMA | Freq: Once | RECTAL | Status: AC
  Administered 2014-11-06: 1 via RECTAL

## 2014-11-06 MED ORDER — ENOXAPARIN SODIUM 40 MG/0.4ML ~~LOC~~ SOLN: 40.0000 mg | SUBCUTANEOUS | Status: AC

## 2014-11-06 MED ORDER — MAGNESIUM HYDROXIDE 400 MG/5ML PO SUSP: 30.0000 mL | Freq: Every day | ORAL | Status: AC | PRN

## 2014-11-06 NOTE — Progress Notes (Signed)
  Subjective: 3 Days Post-Op Procedure(s) (LRB): INTRAMEDULLARY (IM) NAIL INTERTROCHANTRIC (Left) Patient reports pain as mild.   Patient is well, and her cognitive function is improved this morning. Plan is to go Skilled nursing facility after hospital stay. Negative for chest pain and shortness of breath Fever: no Gastrointestinal:Negative for nausea and vomiting  Objective: Vital signs in last 24 hours: Temp:  [97.9 F (36.6 C)-99 F (37.2 C)] 98.6 F (37 C) (11/03 0732) Pulse Rate:  [71-83] 81 (11/03 0733) Resp:  [16-18] 18 (11/03 0732) BP: (139-179)/(65-91) 157/91 mmHg (11/03 0733) SpO2:  [90 %-96 %] 92 % (11/03 0732) Weight:  [65.454 kg (144 lb 4.8 oz)-66.633 kg (146 lb 14.4 oz)] 65.454 kg (144 lb 4.8 oz) (11/03 0740)  Intake/Output from previous day:  Intake/Output Summary (Last 24 hours) at 11/06/14 0752 Last data filed at 11/05/14 2000  Gross per 24 hour  Intake    946 ml  Output      0 ml  Net    946 ml    Intake/Output this shift:    Labs:  Recent Labs  11/04/14 0619 11/05/14 0629 11/06/14 0519  HGB 12.1 10.8* 10.4*    Recent Labs  11/05/14 0629 11/06/14 0519  WBC 9.9 9.5  RBC 3.43* 3.30*  HCT 32.7* 31.4*  PLT 263 276    Recent Labs  11/05/14 0629 11/06/14 0519  NA 142 141  K 4.0 4.2  CL 106 108  CO2 28 30  BUN 33* 26*  CREATININE 0.71 0.59  GLUCOSE 130* 126*  CALCIUM 8.5* 8.2*   No results for input(s): LABPT, INR in the last 72 hours.   EXAM General - Patient is Alert, Disorganized and Confused Extremity - Neurologically intact Sensation intact distally Intact pulses distally Dorsiflexion/Plantar flexion intact Incision: scant drainage No cellulitis present Dressing/Incision - blood tinged drainage Motor Function - intact, moving foot and toes well on exam.   Abdomen distended on exam with tympany.  Pt has normal BS on auscultation.  Past Medical History  Diagnosis Date  . Hyperlipemia   . Dementia   . Alzheimer  disease   . Macular degeneration   . Hypertension   . Uterine prolapse   . Urinary incontinence   . Mitral regurgitation   . Takatsuki syndrome   . Pulmonary hypertension (HCC)     Assessment/Plan: 3 Days Post-Op Procedure(s) (LRB): INTRAMEDULLARY (IM) NAIL INTERTROCHANTRIC (Left) Active Problems:   Femur fracture, left (HCC)  Estimated body mass index is 25.57 kg/(m^2) as calculated from the following:   Height as of this encounter: 5\' 3"  (1.6 m).   Weight as of this encounter: 65.454 kg (144 lb 4.8 oz). Advance diet Up with therapy   Pt is urinating well. Abdomen is distended, pt has not had a BM yet.  Normal BS on auscultation.  Spoke with nurse about Ensure as well as using suppositories and FLEET enema. Hg stable.  Labs reviewed. Pt is stable orthopaedically pending a BM.   Will discharge to SNF when medically stable.  Stables should be removed in 10-14 days. Follow-[facility] in 4-6 weeks.  DVT Prophylaxis - Lovenox, Foot Pumps and TED hose Weight-Bearing as tolerated to left leg  J. Horris LatinoLance Yarlin Breisch, PA-C [facility] 11/06/2014, 7:52 AM

## 2014-11-06 NOTE — Progress Notes (Signed)
Pt to D/C today to Rehab facility. Suppository given d/t no BM for 3 days. Awaiting results. Daughter at bedside, plans to accompany pt. and bring belongings. Transport by EMS via Doctor, general practicestretcher.

## 2014-11-06 NOTE — Discharge Instructions (Signed)

## 2014-11-06 NOTE — Anesthesia Postprocedure Evaluation (Signed)
  Anesthesia Post-op Note  Patient: Regina Bridges  Procedure(s) Performed: Procedure(s): INTRAMEDULLARY (IM) NAIL INTERTROCHANTRIC (Left)  Anesthesia type:Spinal  Patient location: PACU  Post pain: Pain level controlled  Post assessment: Post-op Vital signs reviewed, Patient's Cardiovascular Status Stable, Respiratory Function Stable, Patent Airway and No signs of Nausea or vomiting  Post vital signs: Reviewed and stable  Last Vitals:  Filed Vitals:   11/06/14 0733  BP: 157/91  Pulse: 81  Temp:   Resp:     Level of consciousness: awake, alert  and patient cooperative  Complications: No apparent anesthesia complications

## 2014-11-06 NOTE — Progress Notes (Signed)
NT obtained VS prior to D/C. BP elevated at 179/81. Paged MD. Per Dr. Juliene PinaMody, ok to give hydralizine 10mg  IV prior to D/C. Will notify EMS at pick up.

## 2014-11-06 NOTE — Discharge Summary (Signed)
[facility] - [place] at [facility]   PATIENT NAME: Regina QuitterMarion Bridges    MR#:  [id number]  DATE OF BIRTH:  05/22/1915  DATE OF ADMISSION:  11/02/2014 ADMITTING PHYSICIAN: Milagros LollSrikar Sudini, MD  DATE OF DISCHARGE: 11/06/2014     PRIMARY CARE PHYSICIAN: No PCP Per Patient    ADMISSION DIAGNOSIS:  Left thigh pain [M79.652] Closed fracture of left femur, unspecified fracture morphology, unspecified portion of femur, initial encounter (HCC) [S72.92XA]  DISCHARGE DIAGNOSIS:  Active Problems:   Femur fracture, left (HCC)   SECONDARY DIAGNOSIS:   Past Medical History  Diagnosis Date  . Hyperlipemia   . Dementia   . Alzheimer disease   . Macular degeneration   . Hypertension   . Uterine prolapse   . Urinary incontinence   . Mitral regurgitation   . Takatsuki syndrome   . Pulmonary hypertension [facility](HCC)     HOSPITAL COURSE:  81 year old female with dementia status post mechanical fall about a week ago who presented with ongoing left leg pain and another fall and found to have a left hip fracture.  1. Left intertrochanteric fracture: Patient is postoperative day #3. Hemoglobin remains stable without need for blood transfusion. DVT prophylaxis as per orthopedics.  2. Essential hypertension: Coreg and Benzapril.  3. Dementia: Continue Haldol when necessary   DISCHARGE CONDITIONS AND DIET:  Stable  Regular diet  CONSULTS OBTAINED:  Treatment Team:  Christena FlakeJohn J Poggi, MD  DRUG ALLERGIES:  No Known Allergies  DISCHARGE MEDICATIONS:   Current Discharge Medication List    START taking these medications   Details  enoxaparin (LOVENOX) 40 MG/0.4ML injection Inject 0.4 mLs (40 mg total) into the skin daily. Qty: 14 Syringe, Refills: 0    magnesium hydroxide (MILK OF MAGNESIA) 400 MG/5ML suspension Take 30 mLs by mouth daily as needed for mild constipation. Qty: 360 mL, Refills: 0      CONTINUE these medications which have CHANGED   Details  haloperidol  (HALDOL) 0.5 MG tablet Take 1 tablet (0.5 mg total) by mouth every 8 (eight) hours as needed for agitation (and anxiety.). *Do not exceed 3 doses in 24 hours* Qty: 30 tablet, Refills: 0      CONTINUE these medications which have NOT CHANGED   Details  acetaminophen (TYLENOL) 500 MG tablet Take 1,000 mg by mouth 3 (three) times daily.     aspirin EC 81 MG tablet Take 81 mg by mouth daily.    benazepril (LOTENSIN) 10 MG tablet Take 10 mg by mouth daily.    carvedilol (COREG) 6.25 MG tablet Take 6.25 mg by mouth 2 (two) times daily. Take at 8am and 5pm.    ibuprofen (ADVIL,MOTRIN) 200 MG tablet Take 400 mg by mouth 2 (two) times daily as needed for mild pain or moderate pain.     Melatonin 5 MG TABS Take 5 mg by mouth at bedtime.    Multiple Vitamin (MULTIVITAMIN) tablet Take 1 tablet by mouth daily.    Multiple Vitamins-Minerals (PRESERVISION AREDS 2) CAPS Take 1 capsule by mouth 2 (two) times daily.    omeprazole (PRILOSEC) 20 MG capsule Take 20 mg by mouth daily before breakfast.     oxybutynin (DITROPAN) 5 MG tablet Take 5 mg by mouth 2 (two) times a week. Take 1 tablet twice a week at bedtime on Tuesday and Saturday.    polyethylene glycol (MIRALAX / GLYCOLAX) packet Take 17 g by mouth daily. Mix with 8 oz liquid.  Today   CHIEF COMPLAINT:  Patient pleasantly demented  Family at bedside not agitated no acute events overnight   VITAL SIGNS:  Blood pressure 157/91, pulse 81, temperature 98.6 F (37 C), temperature source Oral, resp. rate 18, height  (1.6 m), weight 65.454 kg (144 lb 4.8 oz), SpO2 92 %.   REVIEW OF SYSTEMS:  Review of Systems  Unable to perform ROS    PHYSICAL EXAMINATION:  GENERAL:  81 y.o.-year-old patient lying in the bed with no acute distress.  NECK:  Supple, no jugular venous distention. No thyroid enlargement, no tenderness.  LUNGS: Normal breath sounds bilaterally, no wheezing, rales,rhonchi  No use of accessory  muscles of respiration.  CARDIOVASCULAR: S1, S2 normal. No murmurs, rubs, or gallops.  ABDOMEN: Soft, non-tender, non-distended. Bowel sounds present. No organomegaly or mass.  EXTREMITIES: No pedal edema, cyanosis, or clubbing.  PSYCHIATRIC: The patient is alert .  SKIN: No obvious rash, lesion, or ulcer.   DATA REVIEW:   CBC  Recent Labs Lab 11/06/14 0519  WBC 9.5  HGB 10.4*  HCT 31.4*  PLT 276    Chemistries   Recent Labs Lab 11/06/14 0519  NA 141  K 4.2  CL 108  CO2 30  GLUCOSE 126*  BUN 26*  CREATININE 0.59  CALCIUM 8.2*    Cardiac Enzymes No results for input(s): TROPONINI in the last 168 hours.  Microbiology Results  @  RADIOLOGY:  No results found.    Management plans discussed with the patient's daughter and she is in agreement. Stable for discharge SNF  Patient should follow up with Dr Joice Lofts in 1-2 weeks  CODE STATUS:     Code Status Orders        Start     Ordered   11/03/14 1559  Do not attempt resuscitation (DNR)   Continuous    Question Answer Comment  In the event of cardiac or respiratory ARREST Do not call a "code blue"   In the event of cardiac or respiratory ARREST Do not perform Intubation, CPR, defibrillation or ACLS   In the event of cardiac or respiratory ARREST Use medication by any route, position, wound care, and other measures to relive pain and suffering. May use oxygen, suction and manual treatment of airway obstruction as needed for comfort.      11/03/14 1558    Advance Directive Documentation        Most Recent Value   Type of Advance Directive  Out of facility DNR (pink MOST or yellow form)   Pre-existing out of facility DNR order (yellow form or pink MOST form)  Physician notified to receive inpatient order   "MOST" Form in Place?        TOTAL TIME TAKING CARE OF THIS PATIENT: 35 minutes.    Note: This dictation was prepared with Dragon dictation along with smaller phrase technology. Any  transcriptional errors that result from this process are unintentional.  Laiana Fratus M.D on 11/06/2014 at 9:29 AM  Between 7am to 6pm - Pager - [phone] After 6pm go to [email or web address] - password [email or web address] [facility]  [place] [facility]  Office  [phone]  CC: Primary care physician; No PCP Per Patient

## 2014-11-06 NOTE — Clinical Social Work Placement (Signed)
   CLINICAL SOCIAL WORK PLACEMENT  NOTE  Date:  11/06/2014  Patient Details  Name: Regina Bridges MRN: [id number] Date of Birth: 03/19/1915  Clinical Social Work is seeking post-discharge placement for this patient at the Skilled  Nursing Facility level of care (*CSW will initial, date and re-position this form in  chart as items are completed):  Yes   Patient/family provided with [place] Clinical Social Work Department's list of facilities offering this level of care within the geographic area requested by the patient (or if unable, by the patient's family).  Yes   Patient/family informed of their freedom to choose among providers that offer the needed level of care, that participate in Medicare, Medicaid or managed care program needed by the patient, have an available bed and are willing to accept the patient.  Yes   Patient/family informed of [facility]'s ownership interest in [facility] and [facility], as well as of the fact that they are under no obligation to receive care at these facilities.  PASRR submitted to EDS on 11/03/14     PASRR number received on 11/03/14     Existing PASRR number confirmed on       FL2 transmitted to all facilities in geographic area requested by pt/family on 11/03/14     FL2 transmitted to all facilities within larger geographic area on 11/04/14     Patient informed that his/her managed care company has contracts with or will negotiate with certain facilities, including the following:        Yes   Patient/family informed of bed offers received.  Patient chooses bed at  [facility] )     Physician recommends and patient chooses bed at      Patient to be transferred to  [facility] ) on 11/06/14.  Patient to be transferred to facility by  [facility] )     Patient family notified on 11/06/14 of transfer.  Name of family member notified:   (Daughter Alan RipperClaire is at bedside and aware of  D/C today. )     PHYSICIAN       Additional Comment:    _______________________________________________ Regina Bridges, Regina Larrabee G, LCSW 11/06/2014, 10:17 AM

## 2014-11-06 NOTE — Progress Notes (Signed)
Patient is medically stable for D/C to [facility] [place] today. Per Delice Bisonara admissions coordinator at [facility] patient is going to private room 212. RN will call report and arrange EMS for transport. Clinical Child psychotherapistocial Worker (CSW) prepared D/C packet and faxed D/C Summary, Ortho Progress note and FL2 to [facility]. Patient's daughter Alan RipperClaire is at bedside and aware of above. Daughter asked about private duty sitters. CSW explained that the facility should have a list of agencies in [place] and it will be an out of pocket expense. Daughter verbalized her understanding. Please reconsult if future social work needs arise. CSW signing off.   Jetta LoutBailey Morgan, LCSWA [phone]

## 2014-11-06 NOTE — Progress Notes (Signed)
Report called to SW and Nursing staff at [facility] [place].

## 2014-11-06 NOTE — Progress Notes (Signed)
Physical Therapy Treatment Patient Details Name: Regina Bridges MRN: [id number] DOB: [birth year] Today's Date: 11/06/2014    History of Present Illness Patient is a 81 y/o female that presents from memory care unit for L femur fx. ORIF completed on 11/03/2014.     PT Comments    Pt making gradual progress towards goals today by efforting herself with transfer training. She is pleasantly confused but is willing to try functional tasks such as standing up and sitting down. She has strength, ROM, and mobility deficits that will continue to benefit from skilled PT in order for her to return closer to her baseline level of function.  Follow Up Recommendations  SNF     Equipment Recommendations       Recommendations for Other Services       Precautions / Restrictions Precautions Precautions: Fall Restrictions Weight Bearing Restrictions: Yes LLE Weight Bearing: Weight bearing as tolerated    Mobility  Bed Mobility Overal bed mobility: +2 for physical assistance;Needs Assistance Bed Mobility: Supine to Sit;Sit to Supine     Supine to sit: Max assist Sit to supine: +2 for physical assistance;Max assist   General bed mobility comments: Pt able to provide effort with bed mobility this date, although still requiring considerable assist for LEs and trunk  Transfers Overall transfer level: Needs assistance Equipment used: Rolling walker (2 wheeled) Transfers: Sit to/from Stand Sit to Stand: +2 physical assistance         General transfer comment: Pt again completing 3 sit-to-stands but is able to provide effort on all three and stand for 20 seconds with assist for each repetition.  Ambulation/Gait Ambulation/Gait assistance:  (not appropriate)               Stairs            Wheelchair Mobility    Modified Rankin (Stroke Patients Only)       Balance Overall balance assessment: Needs assistance Sitting-balance support: Bilateral upper extremity  supported Sitting balance-Leahy Scale: Fair Sitting balance - Comments: Pt requires minor assist to maintain sitting balance     Standing balance-Leahy Scale: Poor Standing balance comment: Pt tilts posterior and is not able to hold herself up without significant assist. She can provide some effort to stay standing but has a hard time pushing her hips forward                     Cognition Arousal/Alertness: Awake/alert Behavior During Therapy: WFL for tasks assessed/performed Overall Cognitive Status: History of cognitive impairments - at baseline                      Exercises      General Comments        Pertinent Vitals/Pain Pain Assessment:  (unable to communicate pain)    Home Living                      Prior Function            PT Goals (current goals can now be found in the care plan section) Acute Rehab PT Goals Patient Stated Goal: unable to state goal PT Goal Formulation: Patient unable to participate in goal setting Time For Goal Achievement: 11/18/14 Potential to Achieve Goals: Fair Progress towards PT goals: Progressing toward goals    Frequency  7X/week    PT Plan Current plan remains appropriate    Co-evaluation  End of Session Equipment Utilized During Treatment: Gait belt Activity Tolerance: Patient tolerated treatment well Patient left: in bed;with bed alarm set;with call bell/phone within reach     Time: 0940-1003 PT Time Calculation (min) (ACUTE ONLY): 23 min  Charges:                       G CodesBenna Dunks:      Kery Batzel 11/06/2014, 4:00 PM  Benna Dunksasey Nakiesha Rumsey, SPT. [phone]

## 2014-11-17 ENCOUNTER — Encounter: Payer: Self-pay | Admitting: Surgery

## 2016-02-27 IMAGING — CT CT CERVICAL SPINE W/O CM
1 of 6 series · 2 of 14 positions shown, 3 images · non-contrast
Comparison: None.

CLINICAL DATA: Fall, hit head

EXAM:
CT HEAD WITHOUT CONTRAST
CT CERVICAL SPINE WITHOUT CONTRAST
TECHNIQUE: Multidetector CT imaging of the head and cervical spine was
performed following the standard protocol without intravenous
contrast. Multiplanar CT image reconstructions of the cervical spine
were also generated.

[Series 12: orthogonal axials · axial · 0.17mm/px · z∈[-218,-165]mm · 2 of 92 slices shown, 3 images]
[im 31/92  soft-tissue]
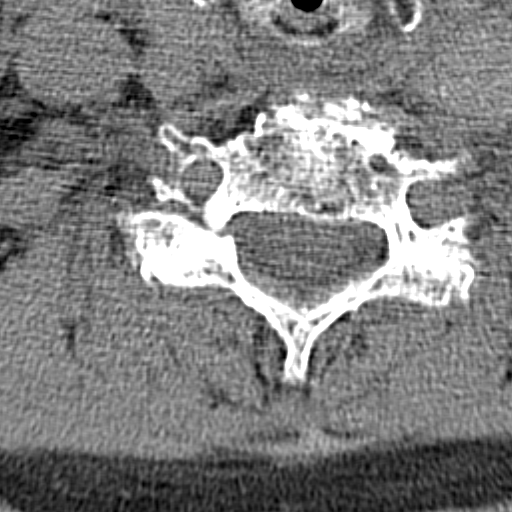
[im 31/92  bone]
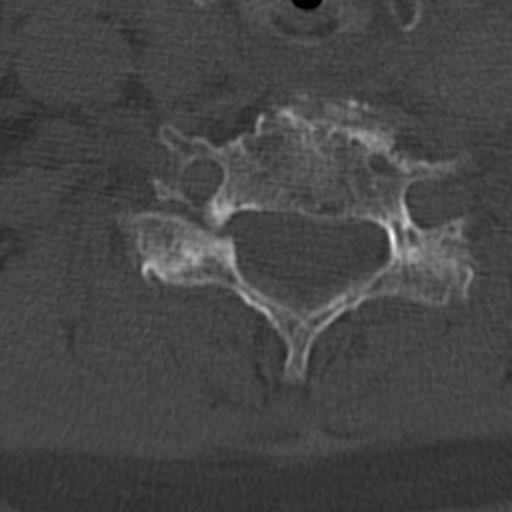
[im 61/92  bone]
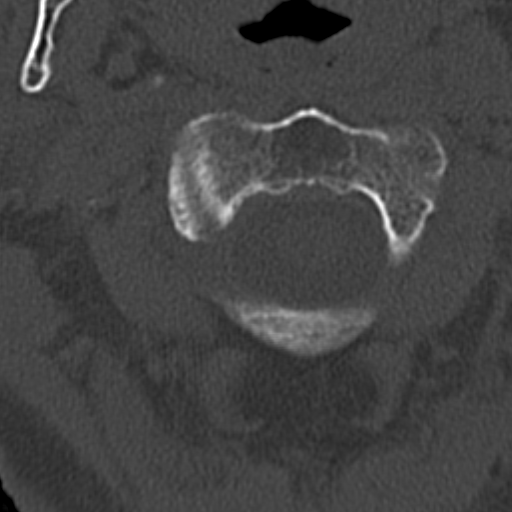

[2 of 14 positions shown; findings below may reference images not displayed]

FINDINGS: CT HEAD FINDINGS

No skull fracture is noted. Mild mucosal thickening left maxillary
sinus. The mastoid air cells are unremarkable. No intracranial
hemorrhage, mass effect or midline shift. Stable atrophy and chronic
white matter disease. Ventricular size is stable from prior exam. No
acute cortical infarction. No mass lesion is noted on this
unenhanced scan. There is scalp swelling and small scalp hematoma in
posterior occipital region axial image 12.

CT CERVICAL SPINE FINDINGS

Axial images of the cervical spine shows no acute fracture or
subluxation. Computer processed images shows no acute fracture or
subluxation. There is diffuse osteopenia. Degenerative changes C1-C2
articulation. Again noted disc space flattening with mild anterior
and mild posterior spurring at C4-C5-C5-C6 and C6-C7 level. No
prevertebral soft tissue swelling. Cervical airway is patent. There
is no pneumothorax in visualized lung apices.
IMPRESSION: 1. Scalp swelling and small subcutaneous hematoma occipital
posterior scalp axial image 12. No intracranial hemorrhage, mass
effect or midline shift. No acute cortical infarction. Stable
atrophy and chronic white matter disease.
2. No cervical spine acute fracture or subluxation. Multilevel
degenerative changes as described above

## 2016-04-14 IMAGING — CR DG CHEST 1V PORT
1 series · 1 of 1 positions shown · non-contrast
Comparison: None.

CLINICAL DATA: Preoperative image

EXAM:
PORTABLE CHEST 1 VIEW

[dg chest port 1 view]
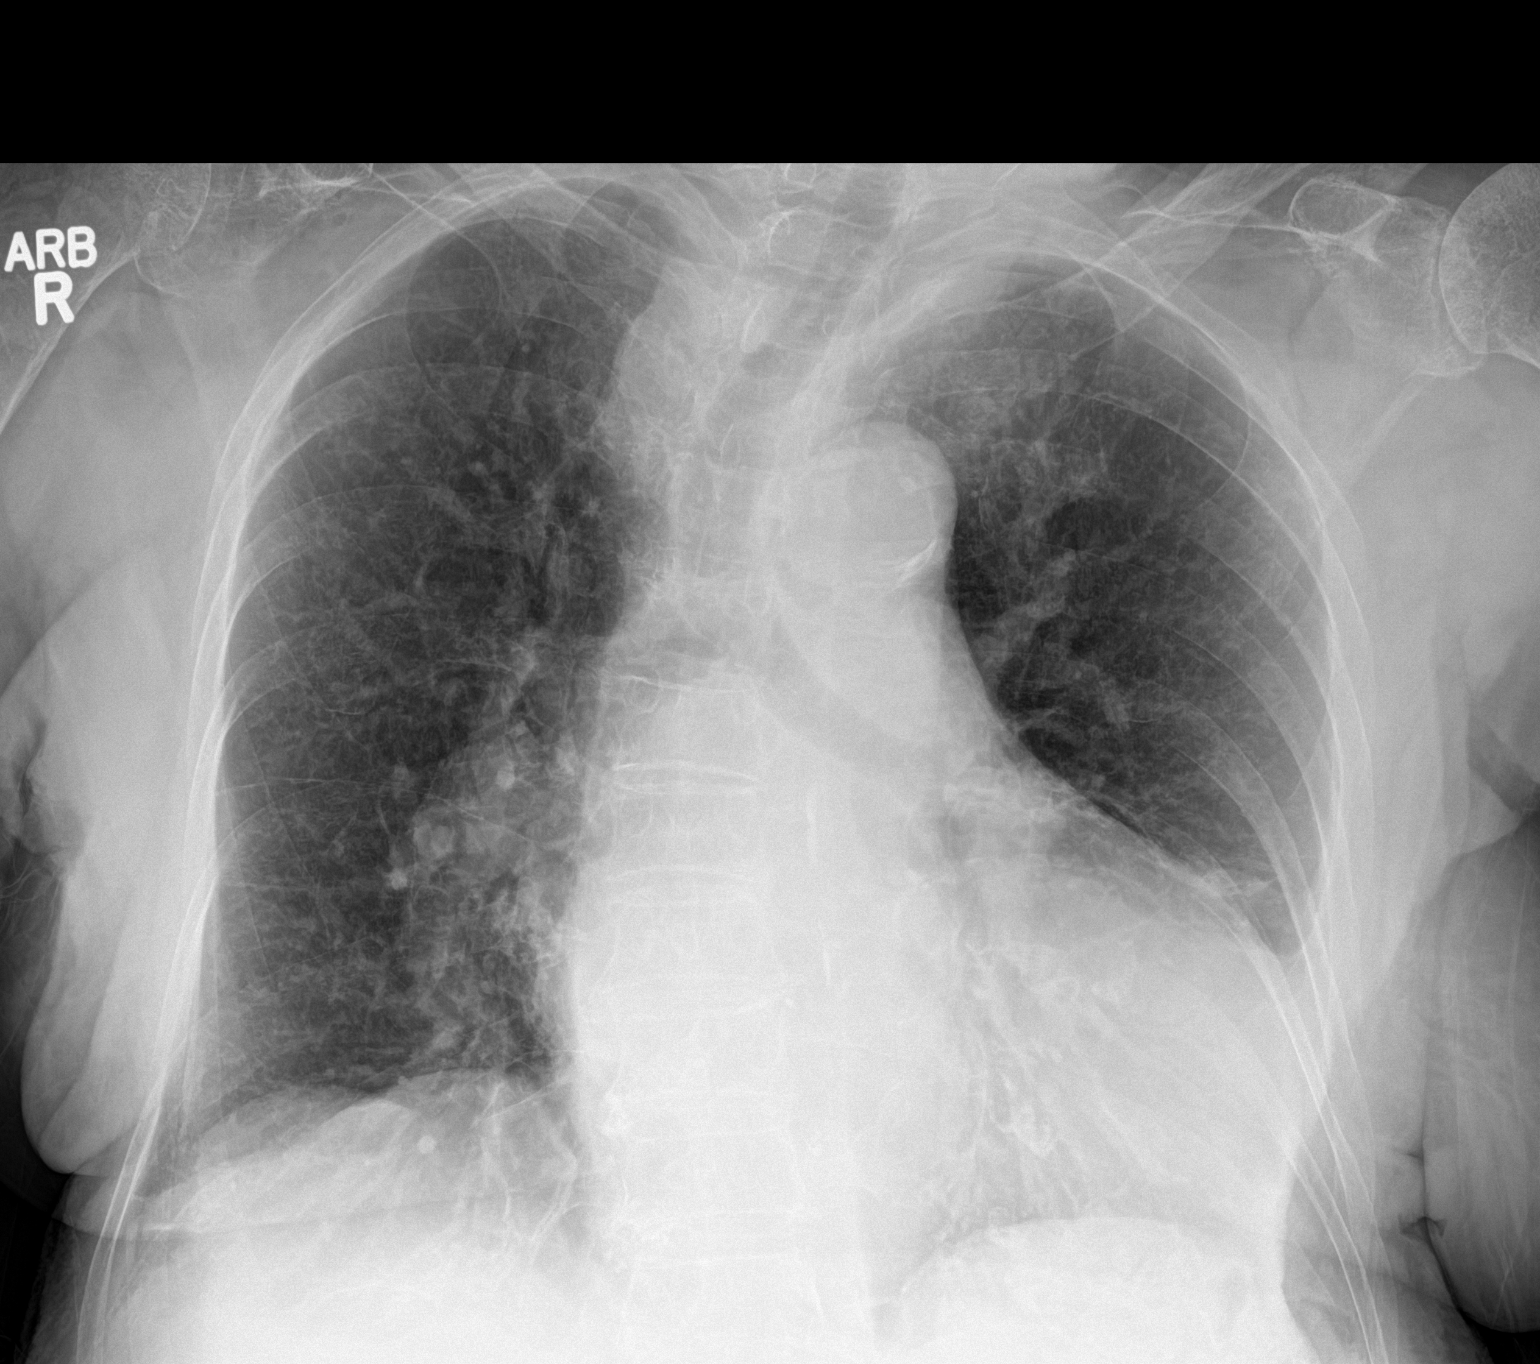

[1 of 1 positions shown; findings below may reference images not displayed]

FINDINGS: Moderate to severe cardiac enlargement. Mild vascular congestion.
Mild chronic appearing bronchitic change. No evidence of pulmonary
edema or consolidation. Limited evaluation of the left lower lobe
due to patient rotation and cardiac enlargement.
IMPRESSION: Significant cardiac enlargement. With vascular congestion with no
evidence of acute findings.

## 2017-01-03 DEATH — deceased
